# Patient Record
Sex: Female | Born: 1960
Health system: Southern US, Community
[De-identification: ages and names within clinical notes are randomized; demographics above are authoritative.]

## PROBLEM LIST (undated history)

## (undated) DIAGNOSIS — F419 Anxiety disorder, unspecified: Secondary | ICD-10-CM

## (undated) DIAGNOSIS — Z8249 Family history of ischemic heart disease and other diseases of the circulatory system: Secondary | ICD-10-CM

## (undated) HISTORY — DX: Family history of ischemic heart disease and other diseases of the circulatory system: Z82.49

## (undated) HISTORY — DX: Anxiety disorder, unspecified: F41.9

---

## 2000-03-18 ENCOUNTER — Encounter: Payer: Self-pay | Admitting: Family Medicine

## 2000-03-18 ENCOUNTER — Ambulatory Visit (HOSPITAL_COMMUNITY): Admission: RE | Admit: 2000-03-18 | Discharge: 2000-03-18 | Payer: Self-pay | Admitting: Family Medicine

## 2001-08-26 ENCOUNTER — Other Ambulatory Visit: Admission: RE | Admit: 2001-08-26 | Discharge: 2001-08-26 | Payer: Self-pay | Admitting: Obstetrics and Gynecology

## 2002-10-09 ENCOUNTER — Other Ambulatory Visit: Admission: RE | Admit: 2002-10-09 | Discharge: 2002-10-09 | Payer: Self-pay | Admitting: Obstetrics and Gynecology

## 2003-11-22 ENCOUNTER — Other Ambulatory Visit: Admission: RE | Admit: 2003-11-22 | Discharge: 2003-11-22 | Payer: Self-pay | Admitting: Obstetrics and Gynecology

## 2005-01-06 ENCOUNTER — Other Ambulatory Visit: Admission: RE | Admit: 2005-01-06 | Discharge: 2005-01-06 | Payer: Self-pay | Admitting: Obstetrics and Gynecology

## 2009-07-30 ENCOUNTER — Encounter: Admission: RE | Admit: 2009-07-30 | Discharge: 2009-07-30 | Payer: Self-pay | Admitting: Obstetrics and Gynecology

## 2012-08-18 ENCOUNTER — Other Ambulatory Visit: Payer: Self-pay | Admitting: Family Medicine

## 2012-08-18 DIAGNOSIS — R1011 Right upper quadrant pain: Secondary | ICD-10-CM

## 2012-08-22 ENCOUNTER — Other Ambulatory Visit: Payer: Self-pay

## 2012-08-26 ENCOUNTER — Ambulatory Visit
Admission: RE | Admit: 2012-08-26 | Discharge: 2012-08-26 | Disposition: A | Discharge status: Home / Self Care | Payer: PRIVATE HEALTH INSURANCE | Source: Ambulatory Visit | Attending: Family Medicine | Admitting: Family Medicine

## 2012-08-26 DIAGNOSIS — R1011 Right upper quadrant pain: Secondary | ICD-10-CM

## 2013-02-13 ENCOUNTER — Encounter: Payer: Self-pay | Admitting: Podiatry

## 2013-02-13 ENCOUNTER — Ambulatory Visit (INDEPENDENT_AMBULATORY_CARE_PROVIDER_SITE_OTHER): Payer: 59 | Admitting: Podiatry

## 2013-02-13 VITALS — BP 119/70 | HR 78 | Resp 12 | Ht 64.0 in | Wt 127.0 lb

## 2013-02-13 DIAGNOSIS — M779 Enthesopathy, unspecified: Secondary | ICD-10-CM

## 2013-02-13 NOTE — Patient Instructions (Signed)
We will contact you when orthotics are available. If you have not heard from [place] in 4-5 weeks please call the office

## 2013-02-13 NOTE — Progress Notes (Signed)
Subjective:     Patient ID: Christy Powers, female   DOB: [birth year], 52 y.o.   MRN: [id number]  Foot Pain   patient states my foot feels better but the callus is coming back and the pain is coming back   Review of Systems  All other systems reviewed and are negative.       Objective:   Physical Exam  Nursing note and vitals reviewed. Constitutional: She appears well-developed.  Cardiovascular: Intact distal pulses.   Musculoskeletal: Normal range of motion.  Neurological: She is alert.  Skin: Skin is warm.   Pain noted around the third metatarsal right.. It is inflamed and difficult for her to walk on after periods of activity    Assessment:     Capsulitis with prominent bone plantar third metatarsal right    Plan:     H&P performed. Recommended that we make her orthotics to reduce pressure against the joint surface and underlying plantarflexed. Scanned for orthotics at the current time

## 2013-04-03 ENCOUNTER — Ambulatory Visit (INDEPENDENT_AMBULATORY_CARE_PROVIDER_SITE_OTHER): Payer: 59 | Admitting: Podiatry

## 2013-04-03 DIAGNOSIS — M779 Enthesopathy, unspecified: Secondary | ICD-10-CM

## 2013-04-03 DIAGNOSIS — M216X9 Other acquired deformities of unspecified foot: Secondary | ICD-10-CM

## 2013-04-05 NOTE — Progress Notes (Signed)
Subjective:     Patient ID: Christy Powers, female   DOB: [birth year], 52 y.o.   MRN: [id number]  HPI patient states my heel is feeling better and I feel like him walking with more comfort. Still having pain a 5 been on my foot all day   Review of Systems     Objective:   Physical Exam    neurovascular status unchanged and patient is well oriented x3. Heel has reduced as far as discomfort but still present with deep palpation Assessment:     Plan her fasciitis improved with mild to moderate pain still noted    Plan:     Instructed on types of shoes which would be appropriate and not going barefoot. Dispensed custom orthotics with instructions on usage today and fitted to feet they seem to be well fitted

## 2013-04-20 ENCOUNTER — Encounter: Payer: Self-pay | Admitting: Podiatry

## 2014-08-20 ENCOUNTER — Other Ambulatory Visit: Payer: Self-pay | Admitting: Physician Assistant

## 2014-08-20 DIAGNOSIS — R1011 Right upper quadrant pain: Secondary | ICD-10-CM

## 2014-08-27 ENCOUNTER — Other Ambulatory Visit: Payer: PRIVATE HEALTH INSURANCE

## 2014-09-07 ENCOUNTER — Ambulatory Visit
Admission: RE | Admit: 2014-09-07 | Discharge: 2014-09-07 | Disposition: A | Discharge status: Home / Self Care | Payer: 59 | Source: Ambulatory Visit | Attending: Physician Assistant | Admitting: Physician Assistant

## 2014-09-07 DIAGNOSIS — R1011 Right upper quadrant pain: Secondary | ICD-10-CM

## 2016-04-21 ENCOUNTER — Other Ambulatory Visit: Payer: Self-pay | Admitting: Obstetrics and Gynecology

## 2016-04-21 DIAGNOSIS — R928 Other abnormal and inconclusive findings on diagnostic imaging of breast: Secondary | ICD-10-CM

## 2016-04-24 ENCOUNTER — Ambulatory Visit
Admission: RE | Admit: 2016-04-24 | Discharge: 2016-04-24 | Disposition: A | Discharge status: Home / Self Care | Payer: 59 | Source: Ambulatory Visit | Attending: Obstetrics and Gynecology | Admitting: Obstetrics and Gynecology

## 2016-04-24 DIAGNOSIS — R928 Other abnormal and inconclusive findings on diagnostic imaging of breast: Secondary | ICD-10-CM

## 2016-05-27 DIAGNOSIS — M9901 Segmental and somatic dysfunction of cervical region: Secondary | ICD-10-CM | POA: Diagnosis not present

## 2016-05-27 DIAGNOSIS — M9903 Segmental and somatic dysfunction of lumbar region: Secondary | ICD-10-CM | POA: Diagnosis not present

## 2016-05-27 DIAGNOSIS — M9905 Segmental and somatic dysfunction of pelvic region: Secondary | ICD-10-CM | POA: Diagnosis not present

## 2016-06-02 DIAGNOSIS — M9901 Segmental and somatic dysfunction of cervical region: Secondary | ICD-10-CM | POA: Diagnosis not present

## 2016-06-02 DIAGNOSIS — M9903 Segmental and somatic dysfunction of lumbar region: Secondary | ICD-10-CM | POA: Diagnosis not present

## 2016-06-02 DIAGNOSIS — M9905 Segmental and somatic dysfunction of pelvic region: Secondary | ICD-10-CM | POA: Diagnosis not present

## 2016-06-11 DIAGNOSIS — M9903 Segmental and somatic dysfunction of lumbar region: Secondary | ICD-10-CM | POA: Diagnosis not present

## 2016-06-11 DIAGNOSIS — M9901 Segmental and somatic dysfunction of cervical region: Secondary | ICD-10-CM | POA: Diagnosis not present

## 2016-06-11 DIAGNOSIS — M9905 Segmental and somatic dysfunction of pelvic region: Secondary | ICD-10-CM | POA: Diagnosis not present

## 2016-06-16 DIAGNOSIS — M9901 Segmental and somatic dysfunction of cervical region: Secondary | ICD-10-CM | POA: Diagnosis not present

## 2016-06-16 DIAGNOSIS — M9903 Segmental and somatic dysfunction of lumbar region: Secondary | ICD-10-CM | POA: Diagnosis not present

## 2016-06-16 DIAGNOSIS — M9905 Segmental and somatic dysfunction of pelvic region: Secondary | ICD-10-CM | POA: Diagnosis not present

## 2016-06-25 DIAGNOSIS — M9905 Segmental and somatic dysfunction of pelvic region: Secondary | ICD-10-CM | POA: Diagnosis not present

## 2016-06-25 DIAGNOSIS — M9903 Segmental and somatic dysfunction of lumbar region: Secondary | ICD-10-CM | POA: Diagnosis not present

## 2016-06-25 DIAGNOSIS — M9901 Segmental and somatic dysfunction of cervical region: Secondary | ICD-10-CM | POA: Diagnosis not present

## 2016-06-29 DIAGNOSIS — H0011 Chalazion right upper eyelid: Secondary | ICD-10-CM | POA: Diagnosis not present

## 2016-07-09 DIAGNOSIS — H0011 Chalazion right upper eyelid: Secondary | ICD-10-CM | POA: Diagnosis not present

## 2016-08-17 ENCOUNTER — Other Ambulatory Visit: Payer: Self-pay | Admitting: Gastroenterology

## 2016-08-17 DIAGNOSIS — E785 Hyperlipidemia, unspecified: Secondary | ICD-10-CM | POA: Diagnosis not present

## 2016-08-17 DIAGNOSIS — I10 Essential (primary) hypertension: Secondary | ICD-10-CM | POA: Diagnosis not present

## 2016-08-17 DIAGNOSIS — R1011 Right upper quadrant pain: Secondary | ICD-10-CM | POA: Diagnosis not present

## 2016-08-17 DIAGNOSIS — G4700 Insomnia, unspecified: Secondary | ICD-10-CM | POA: Diagnosis not present

## 2016-08-25 ENCOUNTER — Ambulatory Visit
Admission: RE | Admit: 2016-08-25 | Discharge: 2016-08-25 | Disposition: A | Discharge status: Home / Self Care | Payer: 59 | Source: Ambulatory Visit | Attending: Gastroenterology | Admitting: Gastroenterology

## 2016-08-25 DIAGNOSIS — R1011 Right upper quadrant pain: Secondary | ICD-10-CM | POA: Diagnosis not present

## 2016-08-25 MED ORDER — IOPAMIDOL (ISOVUE-300) INJECTION 61%
100.0000 mL | Freq: Once | INTRAVENOUS | Status: AC | PRN
  Administered 2016-08-25: 100 mL via INTRAVENOUS

## 2016-11-11 DIAGNOSIS — M9902 Segmental and somatic dysfunction of thoracic region: Secondary | ICD-10-CM | POA: Diagnosis not present

## 2016-11-11 DIAGNOSIS — M542 Cervicalgia: Secondary | ICD-10-CM | POA: Diagnosis not present

## 2016-11-11 DIAGNOSIS — M9901 Segmental and somatic dysfunction of cervical region: Secondary | ICD-10-CM | POA: Diagnosis not present

## 2016-11-16 DIAGNOSIS — M9902 Segmental and somatic dysfunction of thoracic region: Secondary | ICD-10-CM | POA: Diagnosis not present

## 2016-11-16 DIAGNOSIS — M9901 Segmental and somatic dysfunction of cervical region: Secondary | ICD-10-CM | POA: Diagnosis not present

## 2016-11-16 DIAGNOSIS — M542 Cervicalgia: Secondary | ICD-10-CM | POA: Diagnosis not present

## 2016-11-20 DIAGNOSIS — M545 Low back pain: Secondary | ICD-10-CM | POA: Diagnosis not present

## 2016-12-21 DIAGNOSIS — M9901 Segmental and somatic dysfunction of cervical region: Secondary | ICD-10-CM | POA: Diagnosis not present

## 2016-12-21 DIAGNOSIS — M9902 Segmental and somatic dysfunction of thoracic region: Secondary | ICD-10-CM | POA: Diagnosis not present

## 2016-12-21 DIAGNOSIS — M542 Cervicalgia: Secondary | ICD-10-CM | POA: Diagnosis not present

## 2017-04-19 DIAGNOSIS — Z01419 Encounter for gynecological examination (general) (routine) without abnormal findings: Secondary | ICD-10-CM | POA: Diagnosis not present

## 2017-04-19 DIAGNOSIS — Z1231 Encounter for screening mammogram for malignant neoplasm of breast: Secondary | ICD-10-CM | POA: Diagnosis not present

## 2017-08-29 DIAGNOSIS — N390 Urinary tract infection, site not specified: Secondary | ICD-10-CM | POA: Diagnosis not present

## 2017-09-08 DIAGNOSIS — D225 Melanocytic nevi of trunk: Secondary | ICD-10-CM | POA: Diagnosis not present

## 2017-09-08 DIAGNOSIS — L219 Seborrheic dermatitis, unspecified: Secondary | ICD-10-CM | POA: Diagnosis not present

## 2017-09-08 DIAGNOSIS — L821 Other seborrheic keratosis: Secondary | ICD-10-CM | POA: Diagnosis not present

## 2018-01-04 DIAGNOSIS — M545 Low back pain: Secondary | ICD-10-CM | POA: Diagnosis not present

## 2018-01-07 DIAGNOSIS — I10 Essential (primary) hypertension: Secondary | ICD-10-CM | POA: Diagnosis not present

## 2018-01-07 DIAGNOSIS — E7800 Pure hypercholesterolemia, unspecified: Secondary | ICD-10-CM | POA: Diagnosis not present

## 2018-01-07 DIAGNOSIS — M545 Low back pain: Secondary | ICD-10-CM | POA: Diagnosis not present

## 2018-01-10 DIAGNOSIS — M9903 Segmental and somatic dysfunction of lumbar region: Secondary | ICD-10-CM | POA: Diagnosis not present

## 2018-01-10 DIAGNOSIS — M9905 Segmental and somatic dysfunction of pelvic region: Secondary | ICD-10-CM | POA: Diagnosis not present

## 2018-01-10 DIAGNOSIS — M9904 Segmental and somatic dysfunction of sacral region: Secondary | ICD-10-CM | POA: Diagnosis not present

## 2018-01-13 DIAGNOSIS — M9902 Segmental and somatic dysfunction of thoracic region: Secondary | ICD-10-CM | POA: Diagnosis not present

## 2018-01-13 DIAGNOSIS — M9901 Segmental and somatic dysfunction of cervical region: Secondary | ICD-10-CM | POA: Diagnosis not present

## 2018-01-13 DIAGNOSIS — M9903 Segmental and somatic dysfunction of lumbar region: Secondary | ICD-10-CM | POA: Diagnosis not present

## 2018-01-27 DIAGNOSIS — N390 Urinary tract infection, site not specified: Secondary | ICD-10-CM | POA: Diagnosis not present

## 2019-05-10 DIAGNOSIS — M5020 Other cervical disc displacement, unspecified cervical region: Secondary | ICD-10-CM | POA: Diagnosis not present

## 2019-05-10 DIAGNOSIS — M9904 Segmental and somatic dysfunction of sacral region: Secondary | ICD-10-CM | POA: Diagnosis not present

## 2019-05-10 DIAGNOSIS — M9903 Segmental and somatic dysfunction of lumbar region: Secondary | ICD-10-CM | POA: Diagnosis not present

## 2019-05-10 DIAGNOSIS — M9901 Segmental and somatic dysfunction of cervical region: Secondary | ICD-10-CM | POA: Diagnosis not present

## 2019-10-16 DIAGNOSIS — M9903 Segmental and somatic dysfunction of lumbar region: Secondary | ICD-10-CM | POA: Diagnosis not present

## 2019-10-16 DIAGNOSIS — M5020 Other cervical disc displacement, unspecified cervical region: Secondary | ICD-10-CM | POA: Diagnosis not present

## 2019-10-16 DIAGNOSIS — M9904 Segmental and somatic dysfunction of sacral region: Secondary | ICD-10-CM | POA: Diagnosis not present

## 2019-10-16 DIAGNOSIS — M9901 Segmental and somatic dysfunction of cervical region: Secondary | ICD-10-CM | POA: Diagnosis not present

## 2019-10-17 DIAGNOSIS — L719 Rosacea, unspecified: Secondary | ICD-10-CM | POA: Diagnosis not present

## 2019-10-17 DIAGNOSIS — D225 Melanocytic nevi of trunk: Secondary | ICD-10-CM | POA: Diagnosis not present

## 2019-10-17 DIAGNOSIS — L219 Seborrheic dermatitis, unspecified: Secondary | ICD-10-CM | POA: Diagnosis not present

## 2019-10-17 DIAGNOSIS — L578 Other skin changes due to chronic exposure to nonionizing radiation: Secondary | ICD-10-CM | POA: Diagnosis not present

## 2019-10-18 DIAGNOSIS — M5020 Other cervical disc displacement, unspecified cervical region: Secondary | ICD-10-CM | POA: Diagnosis not present

## 2019-10-18 DIAGNOSIS — M9901 Segmental and somatic dysfunction of cervical region: Secondary | ICD-10-CM | POA: Diagnosis not present

## 2019-10-18 DIAGNOSIS — M9903 Segmental and somatic dysfunction of lumbar region: Secondary | ICD-10-CM | POA: Diagnosis not present

## 2019-10-18 DIAGNOSIS — M9904 Segmental and somatic dysfunction of sacral region: Secondary | ICD-10-CM | POA: Diagnosis not present

## 2019-11-16 DIAGNOSIS — M5020 Other cervical disc displacement, unspecified cervical region: Secondary | ICD-10-CM | POA: Diagnosis not present

## 2019-11-16 DIAGNOSIS — M9901 Segmental and somatic dysfunction of cervical region: Secondary | ICD-10-CM | POA: Diagnosis not present

## 2019-11-16 DIAGNOSIS — M9904 Segmental and somatic dysfunction of sacral region: Secondary | ICD-10-CM | POA: Diagnosis not present

## 2019-11-16 DIAGNOSIS — M9903 Segmental and somatic dysfunction of lumbar region: Secondary | ICD-10-CM | POA: Diagnosis not present

## 2019-11-21 DIAGNOSIS — R064 Hyperventilation: Secondary | ICD-10-CM | POA: Diagnosis not present

## 2019-11-21 DIAGNOSIS — R079 Chest pain, unspecified: Secondary | ICD-10-CM | POA: Diagnosis not present

## 2019-11-21 DIAGNOSIS — G4700 Insomnia, unspecified: Secondary | ICD-10-CM | POA: Diagnosis not present

## 2019-11-22 DIAGNOSIS — M9903 Segmental and somatic dysfunction of lumbar region: Secondary | ICD-10-CM | POA: Diagnosis not present

## 2019-11-22 DIAGNOSIS — M5020 Other cervical disc displacement, unspecified cervical region: Secondary | ICD-10-CM | POA: Diagnosis not present

## 2019-11-22 DIAGNOSIS — M9904 Segmental and somatic dysfunction of sacral region: Secondary | ICD-10-CM | POA: Diagnosis not present

## 2019-11-22 DIAGNOSIS — M9901 Segmental and somatic dysfunction of cervical region: Secondary | ICD-10-CM | POA: Diagnosis not present

## 2019-11-29 DIAGNOSIS — M9901 Segmental and somatic dysfunction of cervical region: Secondary | ICD-10-CM | POA: Diagnosis not present

## 2019-11-29 DIAGNOSIS — M9904 Segmental and somatic dysfunction of sacral region: Secondary | ICD-10-CM | POA: Diagnosis not present

## 2019-11-29 DIAGNOSIS — M9903 Segmental and somatic dysfunction of lumbar region: Secondary | ICD-10-CM | POA: Diagnosis not present

## 2019-11-29 DIAGNOSIS — M5020 Other cervical disc displacement, unspecified cervical region: Secondary | ICD-10-CM | POA: Diagnosis not present

## 2020-01-05 ENCOUNTER — Encounter: Payer: Self-pay | Admitting: Cardiovascular Disease

## 2020-01-05 ENCOUNTER — Ambulatory Visit (INDEPENDENT_AMBULATORY_CARE_PROVIDER_SITE_OTHER)
Admission: RE | Admit: 2020-01-05 | Discharge: 2020-01-05 | Disposition: A | Discharge status: Home / Self Care | Payer: Self-pay | Source: Ambulatory Visit | Attending: Cardiovascular Disease | Admitting: Cardiovascular Disease

## 2020-01-05 ENCOUNTER — Encounter (INDEPENDENT_AMBULATORY_CARE_PROVIDER_SITE_OTHER): Payer: Self-pay

## 2020-01-05 ENCOUNTER — Other Ambulatory Visit: Payer: Self-pay

## 2020-01-05 ENCOUNTER — Ambulatory Visit (INDEPENDENT_AMBULATORY_CARE_PROVIDER_SITE_OTHER): Payer: BC Managed Care – PPO | Admitting: Cardiovascular Disease

## 2020-01-05 ENCOUNTER — Telehealth: Payer: Self-pay

## 2020-01-05 VITALS — BP 140/92 | HR 76 | Ht 64.5 in | Wt 153.2 lb

## 2020-01-05 DIAGNOSIS — I10 Essential (primary) hypertension: Secondary | ICD-10-CM

## 2020-01-05 DIAGNOSIS — Z8249 Family history of ischemic heart disease and other diseases of the circulatory system: Secondary | ICD-10-CM | POA: Diagnosis not present

## 2020-01-05 DIAGNOSIS — E782 Mixed hyperlipidemia: Secondary | ICD-10-CM | POA: Diagnosis not present

## 2020-01-05 DIAGNOSIS — E785 Hyperlipidemia, unspecified: Secondary | ICD-10-CM

## 2020-01-05 MED ORDER — ROSUVASTATIN CALCIUM 10 MG PO TABS: 10.0000 mg | ORAL_TABLET | Freq: Every day | ORAL | 3 refills | Status: DC

## 2020-01-05 NOTE — Progress Notes (Signed)
Cardiology Office Note:    Date:  01/05/2020   ID:  Christy Powers, DOB February 08, 1961, MRN [id number]  PCP:  Maury Dus, MD  [facility] Cardiologist:  [facility] Electrophysiologist:  None   Referring MD: Maury Dus, MD   Chief Complaint  Patient presents with  . Hypertension    History of Present Illness:    Christy Powers is a 59 y.o. female with a hx of HTN.   She has a strong family hx of CAD.   We were asked to see her today by Dr. Alyson Ingles for further evaluation of her CV risk.  HTN for years , has been well controlled.   Both mother and father had premature heart disease / stroke . Has a hx of HLD - is on pravachol  Walks pm a regular basis - 1-2 miles a day .  No CP or dyspnea.   No syncope or presyncope   Non smoker  No complaints of any sort.   Just here to assess risk and get an understanding of how she can improve   Past Medical History:  Diagnosis Date  . Anxiety   . Family history of cardiovascular disease     History reviewed. No pertinent surgical history.  Current Medications: Current Meds  Medication Sig  . ALPRAZolam (XANAX) 0.25 MG tablet Take 0.25 mg by mouth at bedtime as needed for anxiety.  . fluocinonide (LIDEX) 0.05 % external solution Apply 1 application topically 2 (two) times daily.  Marland Kitchen ketoconazole (NIZORAL) 2 % shampoo Apply 1 application topically 2 (two) times a week.  Marland Kitchen lisinopril (ZESTRIL) 10 MG tablet Take 10 mg by mouth every morning.  . pravastatin (PRAVACHOL) 40 MG tablet      Allergies:   Patient has no known allergies.   Social History   Socioeconomic History  . Marital status: Married    Spouse name: Not on file  . Number of children: Not on file  . Years of education: Not on file  . Highest education level: Not on file  Occupational History  . Not on file  Tobacco Use  . Smoking status: Never Smoker  . Smokeless tobacco: Never Used  Substance and Sexual Activity  . Alcohol use: Not on file  . Drug  use: Not on file  . Sexual activity: Yes  Other Topics Concern  . Not on file  Social History Narrative  . Not on file   Social Determinants of Health   Financial Resource Strain:   . Difficulty of Paying Living Expenses: Not on file  Food Insecurity:   . Worried About Charity fundraiser in the Last Year: Not on file  . Ran Out of Food in the Last Year: Not on file  Transportation Needs:   . Lack of Transportation (Medical): Not on file  . Lack of Transportation (Non-Medical): Not on file  Physical Activity:   . Days of Exercise per Week: Not on file  . Minutes of Exercise per Session: Not on file  Stress:   . Feeling of Stress : Not on file  Social Connections:   . Frequency of Communication with Friends and Family: Not on file  . Frequency of Social Gatherings with Friends and Family: Not on file  . Attends Religious Services: Not on file  . Active Member of Clubs or Organizations: Not on file  . Attends Archivist Meetings: Not on file  . Marital Status: Not on file     Family  History: The patient's family history includes CVA in her mother; Heart attack in her father; Heart disease (age of onset: 47) in her father; Hypercholesterolemia in her mother; Hypertension in her mother.  ROS:   Please see the history of present illness.     All other systems reviewed and are negative.  EKGs/Labs/Other Studies Reviewed:    The following studies were reviewed today:   EKG:   November 21, 2019: Normal sinus rhythm at 72.  No ST or T wave changes.  Recent Labs: No results found for requested labs within last 8760 hours.  Recent Lipid Panel No results found for: CHOL, TRIG, HDL, CHOLHDL, VLDL, LDLCALC, LDLDIRECT  Physical Exam:    VS:  BP (!) 140/92   Pulse 76   Ht 5' 4.5" (1.638 m)   Wt 153 lb 3.2 oz (69.5 kg)   SpO2 100%   BMI 25.89 kg/m     Wt Readings from Last 3 Encounters:  01/05/20 153 lb 3.2 oz (69.5 kg)  02/13/13 127 lb (57.6 kg)     GEN:  Well  nourished, well developed in no acute distress HEENT: Normal NECK: No JVD; No carotid bruits LYMPHATICS: No lymphadenopathy CARDIAC: RRR, very soft , benign   murmur  RESPIRATORY:  Clear to auscultation without rales, wheezing or rhonchi  ABDOMEN: Soft, non-tender, non-distended MUSCULOSKELETAL:  No edema; No deformity  SKIN: Warm and dry NEUROLOGIC:  Alert and oriented x 3 PSYCHIATRIC:  Normal affect   ASSESSMENT:    1. Hypertension, unspecified type   2. Hyperlipidemia, unspecified hyperlipidemia type   3. Family history of early CAD   45. Mixed hyperlipidemia   5. Essential hypertension   6. Family history of premature CAD    PLAN:      1. Hyperlipidemia:   Reviewed labs from her primary medical doctor from September, 2020: Her total cholesterol was 241.  The HDL is 103.  LDL is 121.  The triglyceride level is 81.  I like to get an NMR lipid profile for further evaluation of her lipids.  We will also get a coronary CT angiogram to assess whether or not she has any early coronary artery disease.  She may need a more aggressive lipid management if she is found to have evidence of CAD.  She is completely asymptomatic and exercises on a fairly regular basis.  2.  Essential Hypertension: Blood pressure is typically well controlled.  It is a little elevated today because of the added stress of coming to the doctor.  She checks her blood pressure on intermittent basis.  She tries to avoid salty foods.  3.  Family history of CAD and stroke :   Father had an MI and died at age 23.   He was a heavy smoker Mother had several strokes - was also a smoker      Medication Adjustments/Labs and Tests Ordered: Current medicines are reviewed at length with the patient today.  Concerns regarding medicines are outlined above.  Orders Placed This Encounter  Procedures  . CT CARDIAC SCORING  . Hepatic function panel  . Basic metabolic panel  . NMR, lipoprofile   No orders of the defined  types were placed in this encounter.    Patient Instructions  Medication Instructions:  Your provider recommends that you continue on your current medications as directed. Please refer to the Current Medication list given to you today.   *If you need a refill on your cardiac medications before your next appointment, please  call your pharmacy*  Lab Work: TODAY: NMR, BMET, liver If you have labs (blood work) drawn today and your tests are completely normal, you will receive your results only by: Marland Kitchen MyChart Message (if you have MyChart) OR . A paper copy in the mail If you have any lab test that is abnormal or we need to change your treatment, we will call you to review the results.  Testing/Procedures: Dr. Acie Fredrickson recommends you have a CALCIUM SCORE.  Follow-Up: At [facility], you and your health needs are our priority.  As part of our continuing mission to provide you with exceptional heart care, we have created designated Provider Care Teams.  These Care Teams include your primary Cardiologist (physician) and Advanced Practice Providers (APPs -  Physician Assistants and Nurse Practitioners) who all work together to provide you with the care you need, when you need it. Your next appointment:   12 month(s) The format for your next appointment:   In Person Provider:   You may see Dr. Acie Fredrickson or one of the following Advanced Practice Providers on your designated Care Team:    Richardson Dopp, PA-C  Robbie Lis, [place]      Signed, Mertie Moores, MD  01/05/2020 1:10 PM    [place]

## 2020-01-05 NOTE — Telephone Encounter (Signed)
Reviewed results with patient who verbalized understanding.   Instructed patient to STOP PRAVACHOL and START Rosuvastatin 10 mg daily.  Lipids, liver, BMET scheduled 12/2. The patient was grateful for assistance.

## 2020-01-05 NOTE — Telephone Encounter (Signed)
-----   Message from Darrell Jewel, RN sent at 01/05/2020  4:40 PM EDT ----- Regarding: lab and med changes  ----- Message ----- From: Thayer Headings, MD Sent: 01/05/2020   4:37 PM EDT To: Rebeca Alert [facility] Triage  Coronary calcium score of 24. This was 5 percentile for age and sex matched control.  Overall , this is not very elevated.   She has no symptoms.  We will continue to aggressively treat her lipids.  She has known hyperlipidemia . Please DC Pravachol  Start Rosuvastatin 10 mg a day . Check lipids, liver enz, BMP in 3 months

## 2020-01-05 NOTE — Patient Instructions (Signed)
Medication Instructions:  Your provider recommends that you continue on your current medications as directed. Please refer to the Current Medication list given to you today.   *If you need a refill on your cardiac medications before your next appointment, please call your pharmacy*  Lab Work: TODAY: NMR, BMET, liver If you have labs (blood work) drawn today and your tests are completely normal, you will receive your results only by: Marland Kitchen MyChart Message (if you have MyChart) OR . A paper copy in the mail If you have any lab test that is abnormal or we need to change your treatment, we will call you to review the results.  Testing/Procedures: Dr. Acie Fredrickson recommends you have a CALCIUM SCORE.  Follow-Up: At [facility], you and your health needs are our priority.  As part of our continuing mission to provide you with exceptional heart care, we have created designated Provider Care Teams.  These Care Teams include your primary Cardiologist (physician) and Advanced Practice Providers (APPs -  Physician Assistants and Nurse Practitioners) who all work together to provide you with the care you need, when you need it. Your next appointment:   12 month(s) The format for your next appointment:   In Person Provider:   You may see Dr. Acie Fredrickson or one of the following Advanced Practice Providers on your designated Care Team:    Richardson Dopp, PA-C  [place], [place]

## 2020-01-06 LAB — HEPATIC FUNCTION PANEL
ALT: 15 IU/L (ref 0–32)
AST: 16 IU/L (ref 0–40)
Albumin: 4.5 g/dL (ref 3.8–4.9)
Alkaline Phosphatase: 78 IU/L (ref 48–121)
Bilirubin Total: 0.5 mg/dL (ref 0.0–1.2)
Bilirubin, Direct: 0.14 mg/dL (ref 0.00–0.40)
Total Protein: 6.8 g/dL (ref 6.0–8.5)

## 2020-01-06 LAB — NMR, LIPOPROFILE
Cholesterol, Total: 218 mg/dL — ABNORMAL HIGH (ref 100–199)
HDL Particle Number: 54.4 umol/L (ref >=30.5)
HDL-C: 108 mg/dL (ref >39)
LDL Particle Number: 921 nmol/L (ref <1000)
LDL Size: 21.6 nm (ref >20.5)
LDL-C (NIH Calc): 99 mg/dL (ref 0–99)
LP-IR Score: <25 (ref <=45)
Small LDL Particle Number: <90 nmol/L (ref <=527)
Triglycerides: 64 mg/dL (ref 0–149)

## 2020-01-06 LAB — BASIC METABOLIC PANEL
BUN/Creatinine Ratio: 12 (ref 9–23)
BUN: 11 mg/dL (ref 6–24)
CO2: 24 mmol/L (ref 20–29)
Calcium: 9.2 mg/dL (ref 8.7–10.2)
Chloride: 102 mmol/L (ref 96–106)
Creatinine, Ser: 0.92 mg/dL (ref 0.57–1.00)
GFR calc Af Amer: 79 mL/min/{1.73_m2} (ref >59)
GFR calc non Af Amer: 68 mL/min/{1.73_m2} (ref >59)
Glucose: 88 mg/dL (ref 65–99)
Potassium: 4.1 mmol/L (ref 3.5–5.2)
Sodium: 142 mmol/L (ref 134–144)

## 2020-01-09 NOTE — Addendum Note (Signed)
Addended by: Harland German A on: 01/09/2020 08:33 AM   Modules accepted: Orders

## 2020-02-06 MED ORDER — LISINOPRIL 10 MG PO TABS: 10.0000 mg | ORAL_TABLET | Freq: Every morning | ORAL | 3 refills | Status: DC

## 2020-04-04 ENCOUNTER — Other Ambulatory Visit: Payer: BC Managed Care – PPO | Admitting: *Deleted

## 2020-04-04 ENCOUNTER — Other Ambulatory Visit: Payer: Self-pay

## 2020-04-04 DIAGNOSIS — E782 Mixed hyperlipidemia: Secondary | ICD-10-CM

## 2020-04-05 LAB — BASIC METABOLIC PANEL WITH GFR
BUN/Creatinine Ratio: 19 (ref 9–23)
BUN: 16 mg/dL (ref 6–24)
CO2: 27 mmol/L (ref 20–29)
Calcium: 9.3 mg/dL (ref 8.7–10.2)
Chloride: 104 mmol/L (ref 96–106)
Creatinine, Ser: 0.83 mg/dL (ref 0.57–1.00)
GFR calc Af Amer: 89 mL/min/{1.73_m2}
GFR calc non Af Amer: 77 mL/min/{1.73_m2}
Glucose: 89 mg/dL (ref 65–99)
Potassium: 4.5 mmol/L (ref 3.5–5.2)
Sodium: 143 mmol/L (ref 134–144)

## 2020-04-05 LAB — NMR, LIPOPROFILE
Cholesterol, Total: 197 mg/dL (ref 100–199)
HDL Particle Number: 57.5 umol/L (ref >=30.5)
HDL-C: 108 mg/dL (ref >39)
LDL Particle Number: 663 nmol/L (ref <1000)
LDL Size: 21.2 nm (ref >20.5)
LDL-C (NIH Calc): 76 mg/dL (ref 0–99)
LP-IR Score: 37 (ref <=45)
Small LDL Particle Number: <90 nmol/L (ref <=527)
Triglycerides: 73 mg/dL (ref 0–149)

## 2020-04-05 LAB — HEPATIC FUNCTION PANEL
ALT: 23 [IU]/L (ref 0–32)
AST: 17 [IU]/L (ref 0–40)
Albumin: 4.4 g/dL (ref 3.8–4.9)
Alkaline Phosphatase: 74 [IU]/L (ref 44–121)
Bilirubin Total: 0.7 mg/dL (ref 0.0–1.2)
Bilirubin, Direct: 0.21 mg/dL (ref 0.00–0.40)
Total Protein: 6.3 g/dL (ref 6.0–8.5)

## 2020-04-08 ENCOUNTER — Telehealth: Payer: Self-pay | Admitting: Cardiovascular Disease

## 2020-04-08 NOTE — Telephone Encounter (Signed)
New message:    Patient calling to ask some questions concering her medications. pleawse call patient back.

## 2020-04-08 NOTE — Telephone Encounter (Signed)
See lab results.  

## 2020-04-21 DIAGNOSIS — R1032 Left lower quadrant pain: Secondary | ICD-10-CM | POA: Diagnosis not present

## 2020-04-21 DIAGNOSIS — R197 Diarrhea, unspecified: Secondary | ICD-10-CM | POA: Diagnosis not present

## 2020-04-21 DIAGNOSIS — K5732 Diverticulitis of large intestine without perforation or abscess without bleeding: Secondary | ICD-10-CM | POA: Diagnosis not present

## 2020-04-24 DIAGNOSIS — R319 Hematuria, unspecified: Secondary | ICD-10-CM | POA: Diagnosis not present

## 2020-04-24 DIAGNOSIS — N390 Urinary tract infection, site not specified: Secondary | ICD-10-CM | POA: Diagnosis not present

## 2020-04-24 DIAGNOSIS — Z01419 Encounter for gynecological examination (general) (routine) without abnormal findings: Secondary | ICD-10-CM | POA: Diagnosis not present

## 2020-04-24 DIAGNOSIS — Z6826 Body mass index (BMI) 26.0-26.9, adult: Secondary | ICD-10-CM | POA: Diagnosis not present

## 2020-04-24 DIAGNOSIS — Z1231 Encounter for screening mammogram for malignant neoplasm of breast: Secondary | ICD-10-CM | POA: Diagnosis not present

## 2020-10-07 ENCOUNTER — Ambulatory Visit (INDEPENDENT_AMBULATORY_CARE_PROVIDER_SITE_OTHER): Payer: BC Managed Care – PPO | Admitting: Podiatry

## 2020-10-07 ENCOUNTER — Ambulatory Visit: Payer: BC Managed Care – PPO

## 2020-10-07 ENCOUNTER — Telehealth: Payer: Self-pay | Admitting: Cardiovascular Disease

## 2020-10-07 ENCOUNTER — Other Ambulatory Visit: Payer: Self-pay

## 2020-10-07 ENCOUNTER — Ambulatory Visit (INDEPENDENT_AMBULATORY_CARE_PROVIDER_SITE_OTHER): Payer: BC Managed Care – PPO

## 2020-10-07 ENCOUNTER — Encounter: Payer: Self-pay | Admitting: Podiatry

## 2020-10-07 DIAGNOSIS — M79672 Pain in left foot: Secondary | ICD-10-CM | POA: Diagnosis not present

## 2020-10-07 DIAGNOSIS — M79671 Pain in right foot: Secondary | ICD-10-CM

## 2020-10-07 DIAGNOSIS — M720 Palmar fascial fibromatosis [Dupuytren]: Secondary | ICD-10-CM | POA: Diagnosis not present

## 2020-10-07 DIAGNOSIS — R1011 Right upper quadrant pain: Secondary | ICD-10-CM | POA: Diagnosis not present

## 2020-10-07 DIAGNOSIS — M778 Other enthesopathies, not elsewhere classified: Secondary | ICD-10-CM

## 2020-10-07 MED ORDER — TRIAMCINOLONE ACETONIDE 10 MG/ML IJ SUSP
10.0000 mg | Freq: Once | INTRAMUSCULAR | Status: AC
  Administered 2020-10-07: 10 mg

## 2020-10-07 NOTE — Telephone Encounter (Signed)
Christy Powers is calling due to scheduling her 1 year follow up with Dr. Acie Fredrickson that is due in September. She is wanting to know if he is wanting her to repeat the CT that she got last year. If so she would like it same day and an order would need to be placed. She is requesting a callback from the nurse with confirmation.

## 2020-10-08 ENCOUNTER — Other Ambulatory Visit: Payer: Self-pay | Admitting: Family Medicine

## 2020-10-08 DIAGNOSIS — R1011 Right upper quadrant pain: Secondary | ICD-10-CM

## 2020-10-08 NOTE — Progress Notes (Signed)
Subjective:   Patient ID: Christy Powers, female   DOB: 60 y.o.   MRN: [id number]   HPI Patient presents stating she has a lot of pain in her right foot and it is making it difficult for her to walk.  States its been getting worse over the last few months and she has trouble bearing weight on her foot.  Patient does not smoke would like to be more active   Review of Systems  All other systems reviewed and are negative.       Objective:  Physical Exam Vitals and nursing note reviewed.  Constitutional:      Appearance: She is well-developed.  Pulmonary:     Effort: Pulmonary effort is normal.  Musculoskeletal:        General: Normal range of motion.  Skin:    General: Skin is warm.  Neurological:     Mental Status: She is alert.     Neurovascular status found to be intact muscle strength was found to be adequate range of motion adequate.  Patient is noted to have exquisite discomfort around the second MPJ right foot with inflammation fluid around the joint surface painful when pressed.  Patient has good digital perfusion well oriented x3     Assessment:  Acute capsulitis with inflammation fluid buildup     Plan:  H&P reviewed condition and at this point sterile prep and injected around the joint surface with 3 mg dexamethasone Kenalog 5 mg Xylocaine advised on rigid bottom shoes padding therapy anti-inflammatories and reappoint to  X-rays were negative for signs of arthritis or stress fracture associated with condition

## 2020-10-08 NOTE — Patient Instructions (Addendum)
Okay third possibility for thyroiditis throat chronic

## 2020-10-08 NOTE — Telephone Encounter (Signed)
RN spoke with patient to confirm that she does not need the calcium score CT this year for her appointment per Dr. Acie Fredrickson. Patient verbalized understanding. Patient has appointment scheduled for 9/6.

## 2020-10-24 ENCOUNTER — Ambulatory Visit
Admission: RE | Admit: 2020-10-24 | Discharge: 2020-10-24 | Disposition: A | Discharge status: Home / Self Care | Payer: BC Managed Care – PPO | Source: Ambulatory Visit | Attending: Family Medicine | Admitting: Family Medicine

## 2020-10-24 DIAGNOSIS — R1011 Right upper quadrant pain: Secondary | ICD-10-CM

## 2020-10-24 DIAGNOSIS — K760 Fatty (change of) liver, not elsewhere classified: Secondary | ICD-10-CM | POA: Diagnosis not present

## 2020-12-03 DIAGNOSIS — K838 Other specified diseases of biliary tract: Secondary | ICD-10-CM | POA: Diagnosis not present

## 2020-12-03 DIAGNOSIS — R1011 Right upper quadrant pain: Secondary | ICD-10-CM | POA: Diagnosis not present

## 2020-12-04 ENCOUNTER — Other Ambulatory Visit: Payer: Self-pay | Admitting: Gastroenterology

## 2020-12-04 DIAGNOSIS — K838 Other specified diseases of biliary tract: Secondary | ICD-10-CM

## 2020-12-18 DIAGNOSIS — L219 Seborrheic dermatitis, unspecified: Secondary | ICD-10-CM | POA: Diagnosis not present

## 2020-12-18 DIAGNOSIS — D225 Melanocytic nevi of trunk: Secondary | ICD-10-CM | POA: Diagnosis not present

## 2020-12-18 DIAGNOSIS — L570 Actinic keratosis: Secondary | ICD-10-CM | POA: Diagnosis not present

## 2020-12-18 DIAGNOSIS — L578 Other skin changes due to chronic exposure to nonionizing radiation: Secondary | ICD-10-CM | POA: Diagnosis not present

## 2020-12-18 DIAGNOSIS — L719 Rosacea, unspecified: Secondary | ICD-10-CM | POA: Diagnosis not present

## 2020-12-21 ENCOUNTER — Other Ambulatory Visit: Payer: Self-pay

## 2020-12-21 ENCOUNTER — Ambulatory Visit
Admission: RE | Admit: 2020-12-21 | Discharge: 2020-12-21 | Disposition: A | Discharge status: Home / Self Care | Payer: BC Managed Care – PPO | Source: Ambulatory Visit | Attending: Gastroenterology | Admitting: Gastroenterology

## 2020-12-21 DIAGNOSIS — K838 Other specified diseases of biliary tract: Secondary | ICD-10-CM | POA: Diagnosis not present

## 2020-12-21 MED ORDER — GADOBENATE DIMEGLUMINE 529 MG/ML IV SOLN
14.0000 mL | Freq: Once | INTRAVENOUS | Status: AC | PRN
  Administered 2020-12-21: 14 mL via INTRAVENOUS

## 2021-01-01 ENCOUNTER — Other Ambulatory Visit: Payer: Self-pay | Admitting: Cardiovascular Disease

## 2021-01-06 NOTE — Progress Notes (Signed)
Cardiology Office Note:    Date:  01/07/2021   ID:  Christy Powers, DOB [birth year], MRN [id number]  PCP:  Maury Dus, MD  [facility] Cardiologist:  [facility] Electrophysiologist:  None   Referring MD: Maury Dus, MD   Chief Complaint  Patient presents with   Hypertension   Hyperlipidemia    Previous notes:    Christy Powers is a 60 y.o. female with a hx of HTN.   She has a strong family hx of CAD.   We were asked to see her today by Dr. Alyson Ingles for further evaluation of her CV risk.  HTN for years , has been well controlled.   Both mother and father had premature heart disease / stroke . Has a hx of HLD - is on pravachol  Walks pm a regular basis - 1-2 miles a day .  No CP or dyspnea.   No syncope or presyncope   Non smoker  No complaints of any sort.   Just here to assess risk and get an understanding of how she can improve   Sept. 6, 2022 Christy Powers is here to follow up with her HTN and HLD.   Has a strong family hx of premature CAD Coronary calcium score of 24. This was 80 percentile for age and sex matched control. In Dec. 2021 LDL particle number was 663.   LDL = 76. Walks regularly    Past Medical History:  Diagnosis Date   Anxiety    Family history of cardiovascular disease     No past surgical history on file.  Current Medications: Current Meds  Medication Sig   ALPRAZolam (XANAX) 0.25 MG tablet Take 0.25 mg by mouth at bedtime as needed for anxiety.   Coenzyme Q10 (CO Q 10) 10 MG CAPS Take by mouth.   fluocinonide (LIDEX) 0.05 % external solution Apply 1 application topically 2 (two) times daily.   ketoconazole (NIZORAL) 2 % shampoo Apply 1 application topically 2 (two) times a week.   Multiple Vitamin (MULTIVITAMIN ADULT) TABS Take by mouth daily.   rosuvastatin (CRESTOR) 10 MG tablet TAKE 1 TABLET BY MOUTH EVERY DAY   TURMERIC PO Take by mouth.   [DISCONTINUED] lisinopril (ZESTRIL) 10 MG tablet TAKE 1 TABLET BY MOUTH EVERY DAY IN THE  MORNING   [DISCONTINUED] valsartan (DIOVAN) 160 MG tablet Take 1 tablet (160 mg total) by mouth daily.     Allergies:   Patient has no known allergies.   Social History   Socioeconomic History   Marital status: Married    Spouse name: Not on file   Number of children: Not on file   Years of education: Not on file   Highest education level: Not on file  Occupational History   Not on file  Tobacco Use   Smoking status: Never   Smokeless tobacco: Never  Substance and Sexual Activity   Alcohol use: Not on file   Drug use: Not on file   Sexual activity: Yes  Other Topics Concern   Not on file  Social History Narrative   Not on file   Social Determinants of Health   Financial Resource Strain: Not on file  Food Insecurity: Not on file  Transportation Needs: Not on file  Physical Activity: Not on file  Stress: Not on file  Social Connections: Not on file     Family History: The patient's family history includes CVA in her mother; Heart attack in her father; Heart disease (age of onset:  34) in her father; Hypercholesterolemia in her mother; Hypertension in her mother.  ROS:   Please see the history of present illness.     All other systems reviewed and are negative.  EKGs/Labs/Other Studies Reviewed:    The following studies were reviewed today:   EKG:   November 21, 2019: Normal sinus rhythm at 72.  No ST or T wave changes.  Recent Labs: 04/04/2020: ALT 23; BUN 16; Creatinine, Ser 0.83; Potassium 4.5; Sodium 143  Recent Lipid Panel No results found for: CHOL, TRIG, HDL, CHOLHDL, VLDL, LDLCALC, LDLDIRECT  Physical Exam:    Physical Exam: Blood pressure 138/88, pulse 88, height 5' 4.5" (1.638 m), weight 165 lb 9.6 oz (75.1 kg), SpO2 99 %.  GEN:  Well nourished, well developed in no acute distress HEENT: Normal NECK: No JVD; No carotid bruits LYMPHATICS: No lymphadenopathy CARDIAC: RRR , no murmurs, rubs, gallops RESPIRATORY:  Clear to auscultation without rales,  wheezing or rhonchi  ABDOMEN: Soft, non-tender, non-distended MUSCULOSKELETAL:  No edema; No deformity  SKIN: Warm and dry NEUROLOGIC:  Alert and oriented x 3  ECG: January 07, 2021: Normal sinus rhythm at 88.  No ST or T wave changes.  ASSESSMENT:    1. Essential hypertension   2. Mixed hyperlipidemia   3. Family history of early CAD     PLAN:      Hyperlipidemia:  labs look stable Check lipids in 3 weeks .  Continue rosuvastatin.  She has very minimal coronary artery calcification.  She has a strong family history of coronary artery disease.    2.  Essential Hypertension: Blood pressures have been a little bit elevated.  We will discontinue lisinopril and try her on valsartan 160 mg a day.  We will check a basic metabolic profile in 3 weeks. .  3.  Family history of CAD and stroke :    Continue aggressive lipid management and blood pressure management.    Medication Adjustments/Labs and Tests Ordered: Current medicines are reviewed at length with the patient today.  Concerns regarding medicines are outlined above.  Orders Placed This Encounter  Procedures   Basic Metabolic Panel (BMET)   Lipid Profile   Hepatic function panel   EKG 12-Lead    Meds ordered this encounter  Medications   DISCONTD: valsartan (DIOVAN) 160 MG tablet    Sig: Take 1 tablet (160 mg total) by mouth daily.    Dispense:  30 tablet    Refill:  11    Order Specific Question:   Supervising Provider    Answer:   Mertie Moores J [[id number]]   valsartan (DIOVAN) 160 MG tablet    Sig: Take 1 tablet (160 mg total) by mouth daily.    Dispense:  30 tablet    Refill:  11    Order Specific Question:   Supervising Provider    Answer:   Thayer Headings [phone]    I will see her again in 1 year.  Patient Instructions  Medication Instructions:  Your physician has recommended you make the following change in your medication: STOP Lisinopril START Valsartan 160 mg once daily  *If you need a refill  on your cardiac medications before your next appointment, please call your pharmacy*   Lab Work: Your physician recommends that you return for lab work on Monday Sept. 26. You may come in anytime after 7:30 am.   You will need to FAST for this appointment - nothing to eat or drink after midnight the night  before except water.  If you have labs (blood work) drawn today and your tests are completely normal, you will receive your results only by: [place] (if you have MyChart) OR A paper copy in the mail If you have any lab test that is abnormal or we need to change your treatment, we will call you to review the results.   Testing/Procedures: None Ordered   Follow-Up: At [facility], you and your health needs are our priority.  As part of our continuing mission to provide you with exceptional heart care, we have created designated Provider Care Teams.  These Care Teams include your primary Cardiologist (physician) and Advanced Practice Providers (APPs -  Physician Assistants and Nurse Practitioners) who all work together to provide you with the care you need, when you need it.   Your next appointment:   1 year(s)  The format for your next appointment:   In Person  Provider:   You may see Mertie Moores, MD or one of the following Advanced Practice Providers on your designated Care Team:   Richardson Dopp, PA-C Robbie Lis, [place]    Signed, Mertie Moores, MD  01/07/2021 10:32 AM    [place]

## 2021-01-07 ENCOUNTER — Ambulatory Visit (INDEPENDENT_AMBULATORY_CARE_PROVIDER_SITE_OTHER): Payer: BC Managed Care – PPO | Admitting: Cardiovascular Disease

## 2021-01-07 ENCOUNTER — Encounter: Payer: Self-pay | Admitting: Cardiovascular Disease

## 2021-01-07 ENCOUNTER — Other Ambulatory Visit: Payer: Self-pay

## 2021-01-07 VITALS — BP 138/88 | HR 88 | Ht 64.5 in | Wt 165.6 lb

## 2021-01-07 DIAGNOSIS — I10 Essential (primary) hypertension: Secondary | ICD-10-CM | POA: Diagnosis not present

## 2021-01-07 DIAGNOSIS — E782 Mixed hyperlipidemia: Secondary | ICD-10-CM

## 2021-01-07 DIAGNOSIS — Z8249 Family history of ischemic heart disease and other diseases of the circulatory system: Secondary | ICD-10-CM

## 2021-01-07 MED ORDER — VALSARTAN 160 MG PO TABS: 160.0000 mg | ORAL_TABLET | Freq: Every day | ORAL | 11 refills | Status: DC

## 2021-01-07 NOTE — Patient Instructions (Signed)
Medication Instructions:  Your physician has recommended you make the following change in your medication: STOP Lisinopril START Valsartan 160 mg once daily  *If you need a refill on your cardiac medications before your next appointment, please call your pharmacy*   Lab Work: Your physician recommends that you return for lab work on Monday Sept. 26. You may come in anytime after 7:30 am.   You will need to FAST for this appointment - nothing to eat or drink after midnight the night before except water.  If you have labs (blood work) drawn today and your tests are completely normal, you will receive your results only by: [place] (if you have MyChart) OR A paper copy in the mail If you have any lab test that is abnormal or we need to change your treatment, we will call you to review the results.   Testing/Procedures: None Ordered   Follow-Up: At [facility], you and your health needs are our priority.  As part of our continuing mission to provide you with exceptional heart care, we have created designated Provider Care Teams.  These Care Teams include your primary Cardiologist (physician) and Advanced Practice Providers (APPs -  Physician Assistants and Nurse Practitioners) who all work together to provide you with the care you need, when you need it.   Your next appointment:   1 year(s)  The format for your next appointment:   In Person  Provider:   You may see Mertie Moores, MD or one of the following Advanced Practice Providers on your designated Care Team:   Richardson Dopp, PA-C [place], [place]

## 2021-01-21 DIAGNOSIS — M9901 Segmental and somatic dysfunction of cervical region: Secondary | ICD-10-CM | POA: Diagnosis not present

## 2021-01-21 DIAGNOSIS — M9903 Segmental and somatic dysfunction of lumbar region: Secondary | ICD-10-CM | POA: Diagnosis not present

## 2021-01-21 DIAGNOSIS — G44209 Tension-type headache, unspecified, not intractable: Secondary | ICD-10-CM | POA: Diagnosis not present

## 2021-01-21 DIAGNOSIS — M9902 Segmental and somatic dysfunction of thoracic region: Secondary | ICD-10-CM | POA: Diagnosis not present

## 2021-01-27 ENCOUNTER — Other Ambulatory Visit: Payer: BC Managed Care – PPO | Admitting: *Deleted

## 2021-01-27 ENCOUNTER — Other Ambulatory Visit: Payer: Self-pay

## 2021-01-27 DIAGNOSIS — I10 Essential (primary) hypertension: Secondary | ICD-10-CM | POA: Diagnosis not present

## 2021-01-27 DIAGNOSIS — E782 Mixed hyperlipidemia: Secondary | ICD-10-CM

## 2021-01-27 DIAGNOSIS — Z8249 Family history of ischemic heart disease and other diseases of the circulatory system: Secondary | ICD-10-CM

## 2021-01-27 LAB — BASIC METABOLIC PANEL
BUN/Creatinine Ratio: 15 (ref 12–28)
BUN: 13 mg/dL (ref 8–27)
CO2: 22 mmol/L (ref 20–29)
Calcium: 8.9 mg/dL (ref 8.7–10.3)
Chloride: 104 mmol/L (ref 96–106)
Creatinine, Ser: 0.84 mg/dL (ref 0.57–1.00)
Glucose: 103 mg/dL — ABNORMAL HIGH (ref 65–99)
Potassium: 4.3 mmol/L (ref 3.5–5.2)
Sodium: 144 mmol/L (ref 134–144)
eGFR: 80 mL/min/{1.73_m2} (ref >59)

## 2021-01-27 LAB — LIPID PANEL
Chol/HDL Ratio: 2.1 ratio (ref 0.0–4.4)
Cholesterol, Total: 220 mg/dL — ABNORMAL HIGH (ref 100–199)
HDL: 104 mg/dL (ref >39)
LDL Chol Calc (NIH): 96 mg/dL (ref 0–99)
Triglycerides: 119 mg/dL (ref 0–149)
VLDL Cholesterol Cal: 20 mg/dL (ref 5–40)

## 2021-01-27 LAB — HEPATIC FUNCTION PANEL
ALT: 24 IU/L (ref 0–32)
AST: 21 IU/L (ref 0–40)
Albumin: 4.7 g/dL (ref 3.8–4.9)
Alkaline Phosphatase: 84 IU/L (ref 44–121)
Bilirubin Total: 0.4 mg/dL (ref 0.0–1.2)
Bilirubin, Direct: <0.1 mg/dL (ref 0.00–0.40)
Total Protein: 6.6 g/dL (ref 6.0–8.5)

## 2021-01-28 DIAGNOSIS — G44209 Tension-type headache, unspecified, not intractable: Secondary | ICD-10-CM | POA: Diagnosis not present

## 2021-01-28 DIAGNOSIS — M9903 Segmental and somatic dysfunction of lumbar region: Secondary | ICD-10-CM | POA: Diagnosis not present

## 2021-01-28 DIAGNOSIS — M9901 Segmental and somatic dysfunction of cervical region: Secondary | ICD-10-CM | POA: Diagnosis not present

## 2021-01-28 DIAGNOSIS — M9902 Segmental and somatic dysfunction of thoracic region: Secondary | ICD-10-CM | POA: Diagnosis not present

## 2021-01-29 ENCOUNTER — Telehealth: Payer: Self-pay | Admitting: *Deleted

## 2021-01-29 ENCOUNTER — Other Ambulatory Visit: Payer: Self-pay | Admitting: Cardiovascular Disease

## 2021-01-29 DIAGNOSIS — E782 Mixed hyperlipidemia: Secondary | ICD-10-CM

## 2021-01-29 MED ORDER — ROSUVASTATIN CALCIUM 20 MG PO TABS: 20.0000 mg | ORAL_TABLET | Freq: Every day | ORAL | 3 refills | Status: DC

## 2021-01-29 NOTE — Telephone Encounter (Signed)
-----   Message from Thayer Headings, MD sent at 01/29/2021 12:15 PM EDT ----- LDL is higher than her previous levels. Is she still on her rosuvastatin 10 mg a day  If so, we need to increase the rosuvastatin to 20 mg a day Continue diet and exercise Recheck lipids and ALT in 3 months

## 2021-01-29 NOTE — Telephone Encounter (Signed)
Patient notified.  She is taking Rosuvastatin 10 mg every day.  She will increase to 20 mg daily and come in for fasting lab work on 12/19. Prescription sent to [facility] on [address]

## 2021-02-07 DIAGNOSIS — L738 Other specified follicular disorders: Secondary | ICD-10-CM | POA: Diagnosis not present

## 2021-02-07 DIAGNOSIS — L219 Seborrheic dermatitis, unspecified: Secondary | ICD-10-CM | POA: Diagnosis not present

## 2021-02-07 DIAGNOSIS — L570 Actinic keratosis: Secondary | ICD-10-CM | POA: Diagnosis not present

## 2021-02-12 DIAGNOSIS — G44209 Tension-type headache, unspecified, not intractable: Secondary | ICD-10-CM | POA: Diagnosis not present

## 2021-02-12 DIAGNOSIS — M9902 Segmental and somatic dysfunction of thoracic region: Secondary | ICD-10-CM | POA: Diagnosis not present

## 2021-02-12 DIAGNOSIS — M9903 Segmental and somatic dysfunction of lumbar region: Secondary | ICD-10-CM | POA: Diagnosis not present

## 2021-02-12 DIAGNOSIS — M9901 Segmental and somatic dysfunction of cervical region: Secondary | ICD-10-CM | POA: Diagnosis not present

## 2021-02-19 DIAGNOSIS — K838 Other specified diseases of biliary tract: Secondary | ICD-10-CM | POA: Diagnosis not present

## 2021-02-19 DIAGNOSIS — K319 Disease of stomach and duodenum, unspecified: Secondary | ICD-10-CM | POA: Diagnosis not present

## 2021-02-19 DIAGNOSIS — R1011 Right upper quadrant pain: Secondary | ICD-10-CM | POA: Diagnosis not present

## 2021-02-27 DIAGNOSIS — M9901 Segmental and somatic dysfunction of cervical region: Secondary | ICD-10-CM | POA: Diagnosis not present

## 2021-02-27 DIAGNOSIS — G44209 Tension-type headache, unspecified, not intractable: Secondary | ICD-10-CM | POA: Diagnosis not present

## 2021-02-27 DIAGNOSIS — M9902 Segmental and somatic dysfunction of thoracic region: Secondary | ICD-10-CM | POA: Diagnosis not present

## 2021-02-27 DIAGNOSIS — M9903 Segmental and somatic dysfunction of lumbar region: Secondary | ICD-10-CM | POA: Diagnosis not present

## 2021-03-31 DIAGNOSIS — M9902 Segmental and somatic dysfunction of thoracic region: Secondary | ICD-10-CM | POA: Diagnosis not present

## 2021-03-31 DIAGNOSIS — M9903 Segmental and somatic dysfunction of lumbar region: Secondary | ICD-10-CM | POA: Diagnosis not present

## 2021-03-31 DIAGNOSIS — M9901 Segmental and somatic dysfunction of cervical region: Secondary | ICD-10-CM | POA: Diagnosis not present

## 2021-03-31 DIAGNOSIS — G44209 Tension-type headache, unspecified, not intractable: Secondary | ICD-10-CM | POA: Diagnosis not present

## 2021-04-01 ENCOUNTER — Other Ambulatory Visit: Payer: Self-pay | Admitting: Cardiovascular Disease

## 2021-04-04 DIAGNOSIS — Z23 Encounter for immunization: Secondary | ICD-10-CM | POA: Diagnosis not present

## 2021-04-04 DIAGNOSIS — L570 Actinic keratosis: Secondary | ICD-10-CM | POA: Diagnosis not present

## 2021-04-07 DIAGNOSIS — K838 Other specified diseases of biliary tract: Secondary | ICD-10-CM | POA: Diagnosis not present

## 2021-04-07 DIAGNOSIS — K2970 Gastritis, unspecified, without bleeding: Secondary | ICD-10-CM | POA: Diagnosis not present

## 2021-04-07 DIAGNOSIS — R1011 Right upper quadrant pain: Secondary | ICD-10-CM | POA: Diagnosis not present

## 2021-04-15 DIAGNOSIS — G44209 Tension-type headache, unspecified, not intractable: Secondary | ICD-10-CM | POA: Diagnosis not present

## 2021-04-15 DIAGNOSIS — M9902 Segmental and somatic dysfunction of thoracic region: Secondary | ICD-10-CM | POA: Diagnosis not present

## 2021-04-15 DIAGNOSIS — M9901 Segmental and somatic dysfunction of cervical region: Secondary | ICD-10-CM | POA: Diagnosis not present

## 2021-04-15 DIAGNOSIS — M9903 Segmental and somatic dysfunction of lumbar region: Secondary | ICD-10-CM | POA: Diagnosis not present

## 2021-04-21 ENCOUNTER — Other Ambulatory Visit: Payer: BC Managed Care – PPO

## 2021-04-23 DIAGNOSIS — M9903 Segmental and somatic dysfunction of lumbar region: Secondary | ICD-10-CM | POA: Diagnosis not present

## 2021-04-23 DIAGNOSIS — M9902 Segmental and somatic dysfunction of thoracic region: Secondary | ICD-10-CM | POA: Diagnosis not present

## 2021-04-23 DIAGNOSIS — G44209 Tension-type headache, unspecified, not intractable: Secondary | ICD-10-CM | POA: Diagnosis not present

## 2021-04-23 DIAGNOSIS — M9901 Segmental and somatic dysfunction of cervical region: Secondary | ICD-10-CM | POA: Diagnosis not present

## 2021-06-03 ENCOUNTER — Other Ambulatory Visit: Payer: BC Managed Care – PPO | Admitting: *Deleted

## 2021-06-03 ENCOUNTER — Other Ambulatory Visit: Payer: Self-pay

## 2021-06-03 DIAGNOSIS — E782 Mixed hyperlipidemia: Secondary | ICD-10-CM

## 2021-06-03 LAB — LIPID PANEL
Chol/HDL Ratio: 1.9 ratio (ref 0.0–4.4)
Cholesterol, Total: 195 mg/dL (ref 100–199)
HDL: 101 mg/dL (ref >39)
LDL Chol Calc (NIH): 74 mg/dL (ref 0–99)
Triglycerides: 118 mg/dL (ref 0–149)
VLDL Cholesterol Cal: 20 mg/dL (ref 5–40)

## 2021-06-03 LAB — ALT: ALT: 30 IU/L (ref 0–32)

## 2021-06-18 DIAGNOSIS — Z13820 Encounter for screening for osteoporosis: Secondary | ICD-10-CM | POA: Diagnosis not present

## 2021-06-18 DIAGNOSIS — Z01419 Encounter for gynecological examination (general) (routine) without abnormal findings: Secondary | ICD-10-CM | POA: Diagnosis not present

## 2021-06-18 DIAGNOSIS — Z1231 Encounter for screening mammogram for malignant neoplasm of breast: Secondary | ICD-10-CM | POA: Diagnosis not present

## 2021-06-18 DIAGNOSIS — Z6829 Body mass index (BMI) 29.0-29.9, adult: Secondary | ICD-10-CM | POA: Diagnosis not present

## 2021-08-08 ENCOUNTER — Encounter: Payer: Self-pay | Admitting: Cardiovascular Disease

## 2021-08-27 DIAGNOSIS — G44209 Tension-type headache, unspecified, not intractable: Secondary | ICD-10-CM | POA: Diagnosis not present

## 2021-08-27 DIAGNOSIS — M9902 Segmental and somatic dysfunction of thoracic region: Secondary | ICD-10-CM | POA: Diagnosis not present

## 2021-08-27 DIAGNOSIS — M9903 Segmental and somatic dysfunction of lumbar region: Secondary | ICD-10-CM | POA: Diagnosis not present

## 2021-08-27 DIAGNOSIS — M9901 Segmental and somatic dysfunction of cervical region: Secondary | ICD-10-CM | POA: Diagnosis not present

## 2021-09-05 DIAGNOSIS — I10 Essential (primary) hypertension: Secondary | ICD-10-CM | POA: Diagnosis not present

## 2021-09-05 DIAGNOSIS — R0789 Other chest pain: Secondary | ICD-10-CM | POA: Diagnosis not present

## 2021-09-10 DIAGNOSIS — G44209 Tension-type headache, unspecified, not intractable: Secondary | ICD-10-CM | POA: Diagnosis not present

## 2021-09-10 DIAGNOSIS — M9903 Segmental and somatic dysfunction of lumbar region: Secondary | ICD-10-CM | POA: Diagnosis not present

## 2021-09-10 DIAGNOSIS — M9901 Segmental and somatic dysfunction of cervical region: Secondary | ICD-10-CM | POA: Diagnosis not present

## 2021-09-10 DIAGNOSIS — M9902 Segmental and somatic dysfunction of thoracic region: Secondary | ICD-10-CM | POA: Diagnosis not present

## 2021-10-16 DIAGNOSIS — H40013 Open angle with borderline findings, low risk, bilateral: Secondary | ICD-10-CM | POA: Diagnosis not present

## 2021-12-24 DIAGNOSIS — M9902 Segmental and somatic dysfunction of thoracic region: Secondary | ICD-10-CM | POA: Diagnosis not present

## 2021-12-24 DIAGNOSIS — M9903 Segmental and somatic dysfunction of lumbar region: Secondary | ICD-10-CM | POA: Diagnosis not present

## 2021-12-24 DIAGNOSIS — G44209 Tension-type headache, unspecified, not intractable: Secondary | ICD-10-CM | POA: Diagnosis not present

## 2021-12-24 DIAGNOSIS — M9901 Segmental and somatic dysfunction of cervical region: Secondary | ICD-10-CM | POA: Diagnosis not present

## 2021-12-25 DIAGNOSIS — D2272 Melanocytic nevi of left lower limb, including hip: Secondary | ICD-10-CM | POA: Diagnosis not present

## 2021-12-25 DIAGNOSIS — L219 Seborrheic dermatitis, unspecified: Secondary | ICD-10-CM | POA: Diagnosis not present

## 2021-12-25 DIAGNOSIS — D225 Melanocytic nevi of trunk: Secondary | ICD-10-CM | POA: Diagnosis not present

## 2021-12-25 DIAGNOSIS — L578 Other skin changes due to chronic exposure to nonionizing radiation: Secondary | ICD-10-CM | POA: Diagnosis not present

## 2022-01-08 ENCOUNTER — Ambulatory Visit: Payer: BC Managed Care – PPO | Attending: Cardiovascular Disease | Admitting: Cardiovascular Disease

## 2022-01-08 ENCOUNTER — Encounter: Payer: Self-pay | Admitting: Cardiovascular Disease

## 2022-01-08 VITALS — BP 136/74 | HR 72 | Ht 64.5 in | Wt 167.8 lb

## 2022-01-08 DIAGNOSIS — I10 Essential (primary) hypertension: Secondary | ICD-10-CM

## 2022-01-08 DIAGNOSIS — E782 Mixed hyperlipidemia: Secondary | ICD-10-CM

## 2022-01-08 DIAGNOSIS — R739 Hyperglycemia, unspecified: Secondary | ICD-10-CM | POA: Diagnosis not present

## 2022-01-08 DIAGNOSIS — I2510 Atherosclerotic heart disease of native coronary artery without angina pectoris: Secondary | ICD-10-CM | POA: Diagnosis not present

## 2022-01-08 MED ORDER — VALSARTAN 160 MG PO TABS: 160.0000 mg | ORAL_TABLET | Freq: Every day | ORAL | 3 refills | Status: DC

## 2022-01-08 NOTE — Patient Instructions (Signed)
Medication Instructions:  REFILLED Valsartan  *If you need a refill on your cardiac medications before your next appointment, please call your pharmacy*   Lab Work: Lipids, ALT, BMET, HbA1C, Lipoprotein (a) If you have labs (blood work) drawn today and your tests are completely normal, you will receive your results only by: [place] (if you have MyChart) OR A paper copy in the mail If you have any lab test that is abnormal or we need to change your treatment, we will call you to review the results.   Testing/Procedures: NONE  Follow-Up: At [facility], you and your health needs are our priority.  As part of our continuing mission to provide you with exceptional heart care, we have created designated Provider Care Teams.  These Care Teams include your primary Cardiologist (physician) and Advanced Practice Providers (APPs -  Physician Assistants and Nurse Practitioners) who all work together to provide you with the care you need, when you need it.  Your next appointment:   1 year(s)  The format for your next appointment:   In Person  Provider:   Mertie Moores, MD       Important Information About Sugar

## 2022-01-08 NOTE — Progress Notes (Signed)
Cardiology Office Note:    Date:  01/08/2022   ID:  Christy Powers, DOB July 13, 1960, MRN [id number]  PCP:  Maury Dus, MD  [facility] Cardiologist:  [facility] Electrophysiologist:  None   Referring MD: Maury Dus, MD   Chief Complaint  Patient presents with   Hypertension         Previous notes:    Christy Powers is a 61 y.o. female with a hx of HTN.   She has a strong family hx of CAD.   We were asked to see her today by Dr. Alyson Ingles for further evaluation of her CV risk.  HTN for years , has been well controlled.   Both mother and father had premature heart disease / stroke . Has a hx of HLD - is on pravachol  Walks pm a regular basis - 1-2 miles a day .  No CP or dyspnea.   No syncope or presyncope   Non smoker  No complaints of any sort.   Just here to assess risk and get an understanding of how she can improve   Sept. 6, 2022 Christy Powers is here to follow up with her HTN and HLD.   Has a strong family hx of premature CAD Coronary calcium score of 24. This was 67 percentile for age and sex matched control. In Dec. 2021 LDL particle number was 663.   LDL = 76. Walks regularly   Sept 7, 2023   Christy Powers regularly  Father died of MI at age 51  Mother had strokes     Past Medical History:  Diagnosis Date   Anxiety    Family history of cardiovascular disease     History reviewed. No pertinent surgical history.  Current Medications: No outpatient medications have been marked as taking for the 01/08/22 encounter (Office Visit) with Yaa Donnellan, Wonda Cheng, MD.     Allergies:   Patient has no known allergies.   Social History   Socioeconomic History   Marital status: Married    Spouse name: Not on file   Number of children: Not on file   Years of education: Not on file   Highest education level: Not on file  Occupational History   Not on file  Tobacco Use   Smoking status: Never   Smokeless tobacco: Never  Substance and Sexual Activity   Alcohol use:  Not on file   Drug use: Not on file   Sexual activity: Yes  Other Topics Concern   Not on file  Social History Narrative   Not on file   Social Determinants of Health   Financial Resource Strain: Not on file  Food Insecurity: Not on file  Transportation Needs: Not on file  Physical Activity: Not on file  Stress: Not on file  Social Connections: Not on file     Family History: The patient's family history includes CVA in her mother; Heart attack in her father; Heart disease (age of onset: 37) in her father; Hypercholesterolemia in her mother; Hypertension in her mother.  ROS:   Please see the history of present illness.     All other systems reviewed and are negative.  EKGs/Labs/Other Studies Reviewed:    The following studies were reviewed today:   EKG:    January 08, 2022: Normal sinus rhythm at 72.  Sinus arrhythmia.  Normal EKG.  Recent Labs: 01/27/2021: BUN 13; Creatinine, Ser 0.84; Potassium 4.3; Sodium 144 06/03/2021: ALT 30  Recent Lipid Panel    Component Value  Date/Time   CHOL 195 06/03/2021 0726   TRIG 118 06/03/2021 0726   HDL 101 06/03/2021 0726   CHOLHDL 1.9 06/03/2021 0726   LDLCALC 74 06/03/2021 0726    Physical Exam:    Physical Exam: Blood pressure 136/74, pulse 72, height 5' 4.5" (1.638 m), weight 167 lb 12.8 oz (76.1 kg), SpO2 99 %.       GEN:  Well nourished, well developed in no acute distress HEENT: Normal NECK: No JVD; No carotid bruits LYMPHATICS: No lymphadenopathy CARDIAC: RRR , no murmurs, rubs, gallops RESPIRATORY:  Clear to auscultation without rales, wheezing or rhonchi  ABDOMEN: Soft, non-tender, non-distended MUSCULOSKELETAL:  No edema; No deformity  SKIN: Warm and dry NEUROLOGIC:  Alert and oriented x 3     ASSESSMENT:    1. Mixed hyperlipidemia   2. Hyperglycemia   3. Primary hypertension   4. Coronary artery disease involving native coronary artery of native heart without angina pectoris      PLAN:       Hyperlipidemia: LDL is 74.  She has a strong family history of coronary artery disease.  She also has a coronary calcium score in the 20s.  I would like for her LDL to be 55.  We will also measure a lipoprotein a.  If her lipoprotein a is elevated I would push for her to be started on a PCSK9 inhibitor.    2.  Essential Hypertension:  .  3.  Family history of CAD and stroke   4.  Hyperglycemia: We will check hemoglobin A1c today. I advised her to eat a low carb diet.  She is to continue exercising regularly.     Medication Adjustments/Labs and Tests Ordered: Current medicines are reviewed at length with the patient today.  Concerns regarding medicines are outlined above.  Orders Placed This Encounter  Procedures   Lipoprotein A (LPA)   Basic metabolic panel   Lipid panel   ALT   Hemoglobin A1c   EKG 12-Lead    Meds ordered this encounter  Medications   valsartan (DIOVAN) 160 MG tablet    Sig: Take 1 tablet (160 mg total) by mouth daily.    Dispense:  90 tablet    Refill:  3    I will see her again in 1 year.  Patient Instructions  Medication Instructions:  REFILLED Valsartan  *If you need a refill on your cardiac medications before your next appointment, please call your pharmacy*   Lab Work: Lipids, ALT, BMET, HbA1C, Lipoprotein (a) If you have labs (blood work) drawn today and your tests are completely normal, you will receive your results only by: [place] (if you have MyChart) OR A paper copy in the mail If you have any lab test that is abnormal or we need to change your treatment, we will call you to review the results.   Testing/Procedures: NONE  Follow-Up: At [facility], you and your health needs are our priority.  As part of our continuing mission to provide you with exceptional heart care, we have created designated Provider Care Teams.  These Care Teams include your primary Cardiologist (physician) and Advanced Practice Providers  (APPs -  Physician Assistants and Nurse Practitioners) who all work together to provide you with the care you need, when you need it.  Your next appointment:   1 year(s)  The format for your next appointment:   In Person  Provider:   Mertie Moores, MD       Important  Information About Sugar         Signed, Mertie Moores, MD  01/08/2022 6:45 PM    [facility]

## 2022-01-09 LAB — BASIC METABOLIC PANEL
BUN/Creatinine Ratio: 12 (ref 12–28)
BUN: 10 mg/dL (ref 8–27)
CO2: 25 mmol/L (ref 20–29)
Calcium: 9.2 mg/dL (ref 8.7–10.3)
Chloride: 104 mmol/L (ref 96–106)
Creatinine, Ser: 0.85 mg/dL (ref 0.57–1.00)
Glucose: 103 mg/dL — ABNORMAL HIGH (ref 70–99)
Potassium: 4.2 mmol/L (ref 3.5–5.2)
Sodium: 142 mmol/L (ref 134–144)
eGFR: 78 mL/min/{1.73_m2} (ref >59)

## 2022-01-09 LAB — LIPID PANEL
Chol/HDL Ratio: 1.7 ratio (ref 0.0–4.4)
Cholesterol, Total: 158 mg/dL (ref 100–199)
HDL: 93 mg/dL (ref >39)
LDL Chol Calc (NIH): 53 mg/dL (ref 0–99)
Triglycerides: 57 mg/dL (ref 0–149)
VLDL Cholesterol Cal: 12 mg/dL (ref 5–40)

## 2022-01-09 LAB — LIPOPROTEIN A (LPA): Lipoprotein (a): 8.6 nmol/L (ref <75.0)

## 2022-01-09 LAB — ALT: ALT: 24 IU/L (ref 0–32)

## 2022-01-09 LAB — HEMOGLOBIN A1C
Est. average glucose Bld gHb Est-mCnc: 114 mg/dL
Hgb A1c MFr Bld: 5.6 % (ref 4.8–5.6)

## 2022-01-16 ENCOUNTER — Encounter: Payer: Self-pay | Admitting: Cardiovascular Disease

## 2022-01-16 MED ORDER — ROSUVASTATIN CALCIUM 20 MG PO TABS: 20.0000 mg | ORAL_TABLET | Freq: Every day | ORAL | 3 refills | Status: DC

## 2022-01-26 ENCOUNTER — Other Ambulatory Visit (HOSPITAL_COMMUNITY): Payer: Self-pay | Admitting: Gastroenterology

## 2022-01-26 DIAGNOSIS — K838 Other specified diseases of biliary tract: Secondary | ICD-10-CM | POA: Diagnosis not present

## 2022-01-26 DIAGNOSIS — R1011 Right upper quadrant pain: Secondary | ICD-10-CM

## 2022-01-26 DIAGNOSIS — K2970 Gastritis, unspecified, without bleeding: Secondary | ICD-10-CM | POA: Diagnosis not present

## 2022-02-19 ENCOUNTER — Ambulatory Visit (HOSPITAL_COMMUNITY)
Admission: RE | Admit: 2022-02-19 | Discharge: 2022-02-19 | Disposition: A | Discharge status: Home / Self Care | Payer: BC Managed Care – PPO | Source: Ambulatory Visit | Attending: Gastroenterology | Admitting: Gastroenterology

## 2022-02-19 DIAGNOSIS — R1011 Right upper quadrant pain: Secondary | ICD-10-CM | POA: Diagnosis not present

## 2022-02-19 MED ORDER — TECHNETIUM TC 99M MEBROFENIN IV KIT
5.3000 | PACK | Freq: Once | INTRAVENOUS | Status: AC | PRN
  Administered 2022-02-19: 5.3 via INTRAVENOUS

## 2022-02-23 DIAGNOSIS — R1011 Right upper quadrant pain: Secondary | ICD-10-CM | POA: Diagnosis not present

## 2022-02-26 ENCOUNTER — Other Ambulatory Visit: Payer: Self-pay | Admitting: Gastroenterology

## 2022-02-26 DIAGNOSIS — R1011 Right upper quadrant pain: Secondary | ICD-10-CM

## 2022-02-26 DIAGNOSIS — K838 Other specified diseases of biliary tract: Secondary | ICD-10-CM

## 2022-03-04 DIAGNOSIS — U071 COVID-19: Secondary | ICD-10-CM | POA: Diagnosis not present

## 2022-03-04 DIAGNOSIS — R509 Fever, unspecified: Secondary | ICD-10-CM | POA: Diagnosis not present

## 2022-03-14 ENCOUNTER — Encounter: Payer: Self-pay | Admitting: Gastroenterology

## 2022-03-19 ENCOUNTER — Ambulatory Visit
Admission: RE | Admit: 2022-03-19 | Discharge: 2022-03-19 | Disposition: A | Discharge status: Home / Self Care | Payer: BC Managed Care – PPO | Source: Ambulatory Visit | Attending: Gastroenterology | Admitting: Gastroenterology

## 2022-03-19 DIAGNOSIS — R1011 Right upper quadrant pain: Secondary | ICD-10-CM

## 2022-03-19 DIAGNOSIS — K838 Other specified diseases of biliary tract: Secondary | ICD-10-CM | POA: Diagnosis not present

## 2022-03-19 DIAGNOSIS — K760 Fatty (change of) liver, not elsewhere classified: Secondary | ICD-10-CM | POA: Diagnosis not present

## 2022-03-19 MED ORDER — GADOPICLENOL 0.5 MMOL/ML IV SOLN
8.0000 mL | Freq: Once | INTRAVENOUS | Status: AC | PRN
  Administered 2022-03-19: 8 mL via INTRAVENOUS

## 2022-03-23 ENCOUNTER — Ambulatory Visit (INDEPENDENT_AMBULATORY_CARE_PROVIDER_SITE_OTHER): Payer: BC Managed Care – PPO | Admitting: Podiatry

## 2022-03-23 ENCOUNTER — Ambulatory Visit (INDEPENDENT_AMBULATORY_CARE_PROVIDER_SITE_OTHER): Payer: BC Managed Care – PPO

## 2022-03-23 ENCOUNTER — Encounter: Payer: Self-pay | Admitting: Podiatry

## 2022-03-23 DIAGNOSIS — M778 Other enthesopathies, not elsewhere classified: Secondary | ICD-10-CM

## 2022-03-23 DIAGNOSIS — M722 Plantar fascial fibromatosis: Secondary | ICD-10-CM

## 2022-03-23 MED ORDER — DICLOFENAC SODIUM 75 MG PO TBEC: 75.0000 mg | DELAYED_RELEASE_TABLET | Freq: Two times a day (BID) | ORAL | 2 refills | Status: DC

## 2022-03-23 NOTE — Patient Instructions (Signed)

## 2022-03-24 NOTE — Progress Notes (Signed)
Subjective:   Patient ID: Christy Powers, female   DOB: 61 y.o.   MRN: [id number]   HPI Patient continues to experience discomfort in the plantar aspect of the right heel with inflammation fluid noted and states it is worse when she gets up in the morning after periods of sitting   ROS      Objective:  Physical Exam  Acute fascial symptomatology that at this point is still not resolving     Assessment:  Pain that is present in the medial fascial band at the insertional point of the tendon into the calcaneus with fluid buildup noted     Plan:  Reviewed condition at this point I have recommended anti-inflammatories to be continued and I placed the patient into a night splint.  I did discuss the possibility that this require surgery or we will consider orthotics or combination but I want to see response to night splint which was fitted properly at the current time.  Reappoint 3 weeks

## 2022-03-31 DIAGNOSIS — L814 Other melanin hyperpigmentation: Secondary | ICD-10-CM | POA: Diagnosis not present

## 2022-03-31 DIAGNOSIS — L719 Rosacea, unspecified: Secondary | ICD-10-CM | POA: Diagnosis not present

## 2022-04-13 ENCOUNTER — Telehealth: Payer: Self-pay | Admitting: Podiatry

## 2022-04-13 ENCOUNTER — Encounter: Payer: Self-pay | Admitting: Podiatry

## 2022-04-13 ENCOUNTER — Ambulatory Visit (INDEPENDENT_AMBULATORY_CARE_PROVIDER_SITE_OTHER): Payer: BC Managed Care – PPO | Admitting: Podiatry

## 2022-04-13 DIAGNOSIS — M722 Plantar fascial fibromatosis: Secondary | ICD-10-CM

## 2022-04-13 MED ORDER — DICLOFENAC SODIUM 75 MG PO TBEC: 75.0000 mg | DELAYED_RELEASE_TABLET | Freq: Two times a day (BID) | ORAL | 2 refills | Status: DC

## 2022-04-13 NOTE — Progress Notes (Signed)
Subjective:   Patient ID: Christy Powers, female   DOB: 61 y.o.   MRN: [id number]   HPI Patient states the arch is feeling some better but I am still having severe discomfort around the second joint right that simply not gotten better and was treated a year and a half ago.  Patient has had this for a long period of time and the pain seems to be getting worse   ROS      Objective:  Physical Exam  Neurovascular status intact muscle strength adequate with patient found to have severe discomfort second metatarsal phalangeal joint right with elevation of the second toe with redness at the inner phalangeal joint with this occurring over the last couple years.  Patient does not walk well on her foot the arch appears to be better with minimal discomfort currently     Assessment:  Chronic inflammatory capsulitis with probable damage to the flexor plate second MPJ right it is not done well over the last couple years and has been treated conservatively with shoe gear modifications injection treatment oral anti-inflammatories and padding     Plan:  H and P reviewed condition and previous x-rays at great length.  Due to long-term chronic nature of condition I recommended digital fusion along with shortening osteotomy patient wants this done I explained procedure risk and she is amendable to the surgery.  At this point I allowed her to read consent form going over alternative treatments complications associated with procedure and after extensive review patient signed consent form scheduled for outpatient surgery with all questions answered.  She understands total recovery can take 6 months no guarantee this will resolve all pain.  I then discussed the boot that I want her to wear postoperatively and it was dispensed today fitted properly to her lower leg and I want her to wear it to get used to it prior to surgery and also to help her arch during the healing process.  Patient to be seen back for surgical  intervention

## 2022-04-13 NOTE — Telephone Encounter (Signed)
DOS: 04/21/2022  [employer]  Metatarsal Osteotomy 2nd Rt ([id number]) Hammertoe Repair w/ Pin 2nd Rt ([id number])  Deductible: $3,000 with $0 remaining Out-of-Pocket: $3,000 with $202 remaining CoInsurance: 0%  Prior authorization is not requested per Shann Medal.   Call Reference #: [id number]

## 2022-04-15 DIAGNOSIS — R1010 Upper abdominal pain, unspecified: Secondary | ICD-10-CM | POA: Diagnosis not present

## 2022-04-17 DIAGNOSIS — M940 Chondrocostal junction syndrome [Tietze]: Secondary | ICD-10-CM | POA: Diagnosis not present

## 2022-04-20 MED ORDER — HYDROCODONE-ACETAMINOPHEN 10-325 MG PO TABS: 1.0000 | ORAL_TABLET | Freq: Three times a day (TID) | ORAL | 0 refills | Status: DC | PRN

## 2022-04-20 NOTE — Addendum Note (Signed)
Addended by: Wallene Huh on: 04/20/2022 05:14 PM   Modules accepted: Orders

## 2022-04-21 ENCOUNTER — Telehealth: Payer: Self-pay

## 2022-04-21 ENCOUNTER — Encounter: Payer: Self-pay | Admitting: Podiatry

## 2022-04-21 DIAGNOSIS — M2041 Other hammer toe(s) (acquired), right foot: Secondary | ICD-10-CM | POA: Diagnosis not present

## 2022-04-21 DIAGNOSIS — M205X1 Other deformities of toe(s) (acquired), right foot: Secondary | ICD-10-CM | POA: Diagnosis not present

## 2022-04-21 DIAGNOSIS — M21541 Acquired clubfoot, right foot: Secondary | ICD-10-CM | POA: Diagnosis not present

## 2022-04-21 MED ORDER — HYDROCODONE-ACETAMINOPHEN 5-325 MG PO TABS: 1.0000 | ORAL_TABLET | Freq: Three times a day (TID) | ORAL | 0 refills | Status: AC | PRN

## 2022-04-21 NOTE — Telephone Encounter (Signed)
Patient is aware 

## 2022-04-29 ENCOUNTER — Ambulatory Visit (INDEPENDENT_AMBULATORY_CARE_PROVIDER_SITE_OTHER): Payer: BC Managed Care – PPO

## 2022-04-29 ENCOUNTER — Ambulatory Visit (INDEPENDENT_AMBULATORY_CARE_PROVIDER_SITE_OTHER): Payer: BC Managed Care – PPO | Admitting: Podiatry

## 2022-04-29 DIAGNOSIS — Z09 Encounter for follow-up examination after completed treatment for conditions other than malignant neoplasm: Secondary | ICD-10-CM | POA: Diagnosis not present

## 2022-04-29 NOTE — Progress Notes (Signed)
Patient seen at the office for POV #1 DOS 04/21/2022 Hammertoe Repair 2nd RT, Metatarsal Osteotomy 2nd RT. Patient denies any fever, nausea, vomiting, chills, chest pain. Pain is minimal at this time. Xrays and incision site was assessed by Dr. Paulla Dolly. Pin intact at this time. Patient is able to slowly transition into a surgical shoe but still use the cam boot for long walks. Foot redressed with a coban wrap and surgical shoe was dispensed today.   All questions were answered by Dr. Paulla Dolly.   Patient was advised to contact office with any concerns or questions.

## 2022-05-07 ENCOUNTER — Other Ambulatory Visit: Payer: Self-pay | Admitting: Podiatry

## 2022-05-07 ENCOUNTER — Telehealth: Payer: Self-pay | Admitting: *Deleted

## 2022-05-07 MED ORDER — MELOXICAM 15 MG PO TABS: 15.0000 mg | ORAL_TABLET | Freq: Every day | ORAL | 2 refills | Status: DC

## 2022-05-07 NOTE — Telephone Encounter (Signed)
Put her on a one a day anti inflammatory

## 2022-05-07 NOTE — Telephone Encounter (Signed)
Patient is calling ,has been using a body pillow at night, has stopped but now having swelling around the knee, inside of the thigh, has been icing behind the knee and using icy hot, would like other recommendations on if she can ice the foot, something  else to elevate the swelling, discomfort. Please advise.

## 2022-05-07 NOTE — Telephone Encounter (Signed)
Patient has been updated.

## 2022-05-13 ENCOUNTER — Other Ambulatory Visit: Payer: BC Managed Care – PPO

## 2022-05-27 ENCOUNTER — Ambulatory Visit (INDEPENDENT_AMBULATORY_CARE_PROVIDER_SITE_OTHER): Payer: BC Managed Care – PPO | Admitting: Podiatry

## 2022-05-27 ENCOUNTER — Ambulatory Visit (INDEPENDENT_AMBULATORY_CARE_PROVIDER_SITE_OTHER): Payer: BC Managed Care – PPO

## 2022-05-27 DIAGNOSIS — T81509A Unspecified complication of foreign body accidentally left in body following unspecified procedure, initial encounter: Secondary | ICD-10-CM | POA: Diagnosis not present

## 2022-05-27 DIAGNOSIS — M79671 Pain in right foot: Secondary | ICD-10-CM | POA: Diagnosis not present

## 2022-05-27 NOTE — Progress Notes (Signed)
Subjective:   Patient ID: Christy Powers, female   DOB: 62 y.o.   MRN: [id number]   HPI Patient states doing well with minimal discomfort and states pin has been doing good excited to have it removed   ROS      Objective:  Physical Exam  Ocular status intact negative Bevelyn Buckles' sign noted wound edges well coapted good alignment of the second toe with excellent position noted     Assessment:  Doing well post digital fusion shortening osteotomy right     Plan:  X-rays reviewed pin removed second digit sterile dressing applied dispensed ankle compression stocking increase activity levels gradual return to soft shoe gear reappoint 4 weeks or earlier if necessary  X-rays indicate that the position of the second toe segment tarsal looks good the segment tarsal slightly sore but I do not think would be any issue with this and we will keep an eye on with screw pin and alignment

## 2022-06-19 DIAGNOSIS — Z124 Encounter for screening for malignant neoplasm of cervix: Secondary | ICD-10-CM | POA: Diagnosis not present

## 2022-06-19 DIAGNOSIS — Z01419 Encounter for gynecological examination (general) (routine) without abnormal findings: Secondary | ICD-10-CM | POA: Diagnosis not present

## 2022-06-19 DIAGNOSIS — Z6828 Body mass index (BMI) 28.0-28.9, adult: Secondary | ICD-10-CM | POA: Diagnosis not present

## 2022-06-19 DIAGNOSIS — Z1231 Encounter for screening mammogram for malignant neoplasm of breast: Secondary | ICD-10-CM | POA: Diagnosis not present

## 2022-06-19 DIAGNOSIS — Z1151 Encounter for screening for human papillomavirus (HPV): Secondary | ICD-10-CM | POA: Diagnosis not present

## 2022-06-24 ENCOUNTER — Ambulatory Visit (INDEPENDENT_AMBULATORY_CARE_PROVIDER_SITE_OTHER): Payer: BC Managed Care – PPO

## 2022-06-24 ENCOUNTER — Encounter: Payer: Self-pay | Admitting: Podiatry

## 2022-06-24 ENCOUNTER — Ambulatory Visit (INDEPENDENT_AMBULATORY_CARE_PROVIDER_SITE_OTHER): Payer: BC Managed Care – PPO | Admitting: Podiatry

## 2022-06-24 DIAGNOSIS — M2041 Other hammer toe(s) (acquired), right foot: Secondary | ICD-10-CM

## 2022-06-24 NOTE — Progress Notes (Signed)
Subjective:   Patient ID: Christy Powers, female   DOB: 62 y.o.   MRN: [id number]   HPI Patient states she seems to be getting better and states that she still is not very good weight on her foot   ROS      Objective:  Physical Exam  Neurovascular status intact negative Bevelyn Buckles' sign noted second metatarsal second digit healing well crusted over some stiffness of the second MPJ and she has not been active with bending the toe     Assessment:  Appears to be an inflammatory condition with patient who has not been active in working the second digit MPJ joint at this time but overall healing well good alignment noted     Plan:  H&P x-ray reviewed discussed the toe is slightly elevated and encouraged her at this point to really increase activity levels and movement exercises.  Offered physical therapy she denies wanting this patient will be seen back in 8 weeks earlier if needed and will return to all normal shoes and encouraged to walk on her forefoot  X-rays indicate that there is good alignment of the second digit slight elevation on the lateral view with second metatarsal healing well screw in place

## 2022-06-26 ENCOUNTER — Encounter: Payer: Self-pay | Admitting: Cardiovascular Disease

## 2022-06-26 ENCOUNTER — Ambulatory Visit: Payer: BC Managed Care – PPO | Attending: Cardiovascular Disease | Admitting: Cardiovascular Disease

## 2022-06-26 ENCOUNTER — Telehealth: Payer: Self-pay | Admitting: Cardiovascular Disease

## 2022-06-26 VITALS — BP 145/105 | HR 77 | Ht 64.5 in | Wt 165.8 lb

## 2022-06-26 DIAGNOSIS — E782 Mixed hyperlipidemia: Secondary | ICD-10-CM

## 2022-06-26 DIAGNOSIS — Z79899 Other long term (current) drug therapy: Secondary | ICD-10-CM | POA: Diagnosis not present

## 2022-06-26 DIAGNOSIS — I10 Essential (primary) hypertension: Secondary | ICD-10-CM

## 2022-06-26 MED ORDER — DICLOFENAC SODIUM 1 % EX GEL: 4.0000 g | Freq: Four times a day (QID) | CUTANEOUS | Status: DC

## 2022-06-26 MED ORDER — POTASSIUM CHLORIDE ER 10 MEQ PO TBCR: 10.0000 meq | EXTENDED_RELEASE_TABLET | Freq: Every day | ORAL | 3 refills | Status: DC

## 2022-06-26 MED ORDER — NEBIVOLOL HCL 5 MG PO TABS: 5.0000 mg | ORAL_TABLET | Freq: Every day | ORAL | 3 refills | Status: DC

## 2022-06-26 MED ORDER — HYDROCHLOROTHIAZIDE 25 MG PO TABS: 25.0000 mg | ORAL_TABLET | Freq: Every day | ORAL | 3 refills | Status: DC

## 2022-06-26 NOTE — Patient Instructions (Signed)
Medication Instructions:  START Bystolic '5mg'$  daily (nebivolol) START Hydrochlorothiazide '25mg'$  daily START Potassium Chloride 18mq daily Voltaren gel OTC (4gm up to four times daily on affected area) STOP Meloxicam (mobic) *If you need a refill on your cardiac medications before your next appointment, please call your pharmacy*   Lab Work: BMET, Troponin T today BMET in March (next appt) If you have labs (blood work) drawn today and your tests are completely normal, you will receive your results only by: [place](if you have [place]Chart) OR A paper copy in the mail If you have any lab test that is abnormal or we need to change your treatment, we will call you to review the results.  Testing/Procedures: NONE  Follow-Up: [facility] you and your [facility] needs are our priority.  As part of our continuing mission to provide you with exceptional heart care, we have created designated Provider Care Teams.  These Care Teams include your primary Cardiologist (physician) and Advanced Practice Providers (APPs -  Physician Assistants and Nurse Practitioners) who all work together to provide you with the care you need, when you need it.  Your next appointment:   July 10, 2022 @ 1:40pm  Provider:   PMertie Moores MD

## 2022-06-26 NOTE — Telephone Encounter (Signed)
Patient calling states her BP is elevated and HR also  Pt c/o BP issue: STAT if pt c/o blurred vision, one-sided weakness or slurred speech  1. What are your last 5 BP readings? 196/114 never been this high HR 118 usually in 80's  2. Are you having any other symptoms (ex. Dizziness, headache, blurred vision, passed out)? no  3. What is your BP issue? Patient states her BP and HR are both very high this morning. She says she is not having any symptoms.

## 2022-06-26 NOTE — Telephone Encounter (Signed)
Call transferred to triage.  Patient reports BP this morning (2 hours after taking BP med): 196/114, HR 118.  Patient reports BP/HR trends are 140s-150s/80s-90s, HR 80s.  Patient denies any CP, SOB, headache. Reports she ate pizza last night. Patient also reports she has a trip to take this afternoon and she gets anxious when she has to drive anywhere. Asked patient if she had taken her Xanax, she states she has not. Patient took half tablet while on the phone, she states a whole tablet is too much for her and she would not be able to drive but felt OK to take a half dose.  Patient currently taking valsartan '160mg'$  QD.   Will forward to Dr. Elmarie Shiley nurse. Patient requests callback with advisement on cell phone: [phone].

## 2022-06-26 NOTE — Telephone Encounter (Signed)
Called and spoke with patient who at this time states BP is 169/112, HR 90. States BP at [facility] office in the last week was 150/90, does not check routinely at home. She denies all symptoms of headache, blurred vision, chest pain, SOB.  Questioned patient on any new supplements or stimulants. She condones beginning to consistently take "new Leaf natur[employer] CBD gummies" for sleep, but no other changes. I explained that these are not FDA regulated so no sure way to know what's actually in them. In any event, we need to see her and offered opening for 2:30 today, which she accepted. BP will be addressed during visit. She checked again at finale of call and BP was 163/109, HR 99.

## 2022-06-26 NOTE — Progress Notes (Signed)
Cardiology Office Note:    Date:  06/26/2022   ID:  Christy Powers, DOB [birth year], MRN [id number]  PCP:  Maury Dus, MD (Inactive)  [facility] Cardiologist:  Jordan Caraveo  [facility] Electrophysiologist:  None   Referring MD: No ref. provider found   Chief Complaint  Patient presents with   Hypertension        Hyperlipidemia    Previous notes:    Christy Powers is a 62 y.o. female with a hx of HTN.   She has a strong family hx of CAD.   We were asked to see her today by Dr. Alyson Ingles for further evaluation of her CV risk.  HTN for years , has been well controlled.   Both mother and father had premature heart disease / stroke . Has a hx of HLD - is on pravachol  Walks pm a regular basis - 1-2 miles a day .  No CP or dyspnea.   No syncope or presyncope   Non smoker  No complaints of any sort.   Just here to assess risk and get an understanding of how she can improve   Sept. 6, 2022 Christy Powers is here to follow up with her HTN and HLD.   Has a strong family hx of premature CAD Coronary calcium score of 24. This was 44 percentile for age and sex matched control. In Dec. 2021 LDL particle number was 663.   LDL = 76. Walks regularly   Sept 7, 2023   Christy Powers regularly  Father died of MI at age 70  Mother had strokes   Christy Powers is seen for new HTN Noted her BP to be fast   Woke up today with her heart racing  178/110  Is having some jaw tingling and tingling along the inside of her left arm   Is not eating much salt but did eat pizza last night  The pizza did not sound very salty   Has been on Meloxicam for her   foot surgery  She has been on meloxicam for 6 weeks.  She really does not check her blood pressure regularly but did decide to check it today because she felt poorly ( heart racing )   Has been on Lidocaine patches  Takes CBD gummies for sleep    Lots of indigestion today  Wt is 165 ( down 2 lbs ) Had foot surgery several months  Had 1 diet coke this am She has  a strong family history of coronary artery disease.   LP(a) on January 08, 2022 is 8.6.  Will check BMP  She had her Hb checked at gyn and she is not anemic Troponin  Past Medical History:  Diagnosis Date   Anxiety    Family history of cardiovascular disease     History reviewed. No pertinent surgical history.  Current Medications: Current Meds  Medication Sig   ALPRAZolam (XANAX) 0.25 MG tablet Take 0.25 mg by mouth at bedtime as needed for anxiety.   Coenzyme Q10 (CO Q 10) 10 MG CAPS Take by mouth.   diclofenac Sodium (VOLTAREN) 1 % GEL Apply 4 g topically 4 (four) times daily.   fluocinonide (LIDEX) 0.05 % external solution Apply 1 application topically 2 (two) times daily.   hydrochlorothiazide (HYDRODIURIL) 25 MG tablet Take 1 tablet (25 mg total) by mouth daily.   ketoconazole (NIZORAL) 2 % shampoo Apply 1 application topically 2 (two) times a week.   Multiple Vitamin (MULTIVITAMIN ADULT) TABS Take by mouth daily.  nebivolol (BYSTOLIC) 5 MG tablet Take 1 tablet (5 mg total) by mouth daily.   pantoprazole (PROTONIX) 40 MG tablet Take 40 mg by mouth every morning.   potassium chloride (KLOR-CON) 10 MEQ tablet Take 1 tablet (10 mEq total) by mouth daily.   rosuvastatin (CRESTOR) 20 MG tablet Take 1 tablet (20 mg total) by mouth daily.   TURMERIC PO Take by mouth.   valsartan (DIOVAN) 160 MG tablet Take 1 tablet (160 mg total) by mouth daily.   [DISCONTINUED] meloxicam (MOBIC) 15 MG tablet Take 1 tablet (15 mg total) by mouth daily.     Allergies:   Patient has no known allergies.   Social History   Socioeconomic History   Marital status: Married    Spouse name: Not on file   Number of children: Not on file   Years of education: Not on file   Highest education level: Not on file  Occupational History   Not on file  Tobacco Use   Smoking status: Never   Smokeless tobacco: Never  Substance and Sexual Activity   Alcohol use: Not on file   Drug use: Not on file    Sexual activity: Yes  Other Topics Concern   Not on file  Social History Narrative   Not on file   Social Determinants of Health   Financial Resource Strain: Not on file  Food Insecurity: Not on file  Transportation Needs: Not on file  Physical Activity: Not on file  Stress: Not on file  Social Connections: Not on file     Family History: The patient's family history includes CVA in her mother; Heart attack in her father; Heart disease (age of onset: 27) in her father; Hypercholesterolemia in her mother; Hypertension in her mother.  ROS:   Please see the history of present illness.     All other systems reviewed and are negative.  EKGs/Labs/Other Studies Reviewed:    The following studies were reviewed today:   EKG:     June 26, 2022: Normal sinus rhythm at 77.  Recent Labs: 01/08/2022: ALT 24; BUN 10; Creatinine, Ser 0.85; Potassium 4.2; Sodium 142  Recent Lipid Panel    Component Value Date/Time   CHOL 158 01/08/2022 0842   TRIG 57 01/08/2022 0842   HDL 93 01/08/2022 0842   CHOLHDL 1.7 01/08/2022 0842   LDLCALC 53 01/08/2022 0842    Physical Exam:    Physical Exam: Blood pressure (!) 145/105, pulse 77, height 5' 4.5" (1.638 m), weight 165 lb 12.8 oz (75.2 kg), SpO2 98 %.  HYPERTENSION CONTROL Vitals:   06/26/22 1451 06/26/22 1515  BP: (!) 178/110 (!) 145/105    The patient's blood pressure is elevated above target today.  In order to address the patient's elevated BP: A new medication was prescribed today.       GEN:  Well nourished, well developed in no acute distress HEENT: Normal NECK: No JVD; No carotid bruits LYMPHATICS: No lymphadenopathy CARDIAC: RRR , no murmurs, rubs, gallops RESPIRATORY:  Clear to auscultation without rales, wheezing or rhonchi  ABDOMEN: Soft, non-tender, non-distended MUSCULOSKELETAL:  No edema; No deformity  SKIN: Warm and dry NEUROLOGIC:  Alert and oriented x 3     ASSESSMENT:    1. Mixed hyperlipidemia    2. Primary hypertension   3. Medication management       PLAN:      Hyperlipidemia: LDL is 74.  She has a strong family history of coronary artery disease.  She also  has a coronary calcium score in the 20s.  I would like for her LDL to be 55.  We will also measure a lipoprotein a.      2.  Essential Hypertension:    Blood pressure is very elevated today.  I have a suspicion that her blood pressure has been high due to her meloxicam and diclofenac therapy but she did not check it until today.  Now her blood pressure is markedly elevated and this is caused some degree of anxiety.  Will have her discontinue the oral meloxicam.  Will give her diclofenac gel over-the-counter to take as directed for her postsurgical inflammation.  Will start her on Bystolic 5 mg a day, HCTZ 25 mg a day, potassium chloride 10 mill equivalents a day.  I will see her back on March 8 for an office visit and basic metabolic check.  She is having some unusual tingling in her jaw and her left arm.  Will check a troponin level today.  Her EKG does not show any acute abnormalities. .  3.  Family history of CAD and stroke   4.  Hyperglycemia:      Medication Adjustments/Labs and Tests Ordered: Current medicines are reviewed at length with the patient today.  Concerns regarding medicines are outlined above.  Orders Placed This Encounter  Procedures   Basic metabolic panel   Troponin T   Basic metabolic panel   EKG [id number]    Meds ordered this encounter  Medications   nebivolol (BYSTOLIC) 5 MG tablet    Sig: Take 1 tablet (5 mg total) by mouth daily.    Dispense:  90 tablet    Refill:  3   hydrochlorothiazide (HYDRODIURIL) 25 MG tablet    Sig: Take 1 tablet (25 mg total) by mouth daily.    Dispense:  90 tablet    Refill:  3   potassium chloride (KLOR-CON) 10 MEQ tablet    Sig: Take 1 tablet (10 mEq total) by mouth daily.    Dispense:  90 tablet    Refill:  3   diclofenac Sodium (VOLTAREN) 1 %  GEL    Sig: Apply 4 g topically 4 (four) times daily.    Dispense:  100 g    I will see her again in 1 year.  Patient Instructions  Medication Instructions:  START Bystolic '5mg'$  daily (nebivolol) START Hydrochlorothiazide '25mg'$  daily START Potassium Chloride 31mq daily Voltaren gel OTC (4gm up to four times daily on affected area) STOP Meloxicam (mobic) *If you need a refill on your cardiac medications before your next appointment, please call your pharmacy*   Lab Work: BMET, Troponin T today BMET in March (next appt) If you have labs (blood work) drawn today and your tests are completely normal, you will receive your results only by: [place](if you have MyChart) OR A paper copy in the mail If you have any lab test that is abnormal or we need to change your treatment, we will call you to review the results.  Testing/Procedures: NONE  Follow-Up: [facility] you and your health needs are our priority.  As part of our continuing mission to provide you with exceptional heart care, we have created designated Provider Care Teams.  These Care Teams include your primary Cardiologist (physician) and Advanced Practice Providers (APPs -  Physician Assistants and Nurse Practitioners) who all work together to provide you with the care you need, when you need it.  Your next appointment:   July 10, 2022 @ 1:40pm  Provider:   Mertie Moores, MD        Signed, Mertie Moores, MD  06/26/2022 5:08 PM    [place]

## 2022-06-27 LAB — BASIC METABOLIC PANEL
BUN/Creatinine Ratio: 15 (ref 12–28)
BUN: 13 mg/dL (ref 8–27)
CO2: 24 mmol/L (ref 20–29)
Calcium: 10.2 mg/dL (ref 8.7–10.3)
Chloride: 101 mmol/L (ref 96–106)
Creatinine, Ser: 0.87 mg/dL (ref 0.57–1.00)
Glucose: 103 mg/dL — ABNORMAL HIGH (ref 70–99)
Potassium: 4 mmol/L (ref 3.5–5.2)
Sodium: 141 mmol/L (ref 134–144)
eGFR: 75 mL/min/{1.73_m2} (ref >59)

## 2022-06-27 LAB — TROPONIN T: Troponin T (Highly Sensitive): 10 ng/L (ref 0–14)

## 2022-07-09 NOTE — Progress Notes (Signed)
Cardiology Office Note:    Date:  07/11/2022   ID:  Christy Powers, DOB [birth year], MRN [id number]  PCP:  Maury Dus, MD (Inactive)  [facility] Cardiologist:  Tricha Ruggirello  [facility] Electrophysiologist:  None   Referring MD: No ref. provider found   Chief Complaint  Patient presents with   Hypertension        Hyperlipidemia    Previous notes:    Christy Powers is a 62 y.o. female with a hx of HTN.   She has a strong family hx of CAD.   We were asked to see her today by Dr. Alyson Ingles for further evaluation of her CV risk.  HTN for years , has been well controlled.   Both mother and father had premature heart disease / stroke . Has a hx of HLD - is on pravachol  Walks pm a regular basis - 1-2 miles a day .  No CP or dyspnea.   No syncope or presyncope   Non smoker  No complaints of any sort.   Just here to assess risk and get an understanding of how she can improve   Sept. 6, 2022 Christy Powers is here to follow up with her HTN and HLD.   Has a strong family hx of premature CAD Coronary calcium score of 24. This was 66 percentile for age and sex matched control. In Dec. 2021 LDL particle number was 663.   LDL = 76. Walks regularly   Sept 7, 2023   Christy Powers regularly  Father died of MI at age 38  Mother had strokes   Christy Powers is seen for new HTN Noted her BP to be fast   Woke up today with her heart racing  178/110  Is having some jaw tingling and tingling along the inside of her left arm   Is not eating much salt but did eat pizza last night  The pizza did not sound very salty   Has been on Meloxicam for her   foot surgery  She has been on meloxicam for 6 weeks.  She really does not check her blood pressure regularly but did decide to check it today because she felt poorly ( heart racing )   Has been on Lidocaine patches  Takes CBD gummies for sleep    Lots of indigestion today  Wt is 165 ( down 2 lbs ) Had foot surgery several months  Had 1 diet coke this am She has a  strong family history of coronary artery disease.   LP(a) on January 08, 2022 is 8.6.  Will check BMP  She had her Hb checked at gyn and she is not anemic Troponin   July 10, 2022 Christy Powers is seen for follow up of her chest pain , HTN She had markedly elevated last week.   She was on moloxicam  We stopped the meloxicam and gave her Voltaren gel.  We started Bystolic 5 mg a day, HCTZ 25 mg a day, potassium chloride once a day.  She now returns with a very normal/low blood pressure.  She is feeling quite a bit better.  We will be discontinuing the HCTZ and potassium for now.  She will keep these on hand.  We may need to restart them back on an alternate day schedule or perhaps half dose at some point if her blood pressure increases.  She will continue to use the Voltaren gel and hold off on using meloxicam as much as she can.  We discussed a coronary  calcium score from September, 2021.  Her coronary calcium score was 24 which places her in the 80th percentile for age and sex matched controls  LP(a)  is 8.4 ( normal)  Last lipid levels from Sept. 2023 LDL is 53 Chol = 158 HDL = 93 Trigs = 57      Past Medical History:  Diagnosis Date   Anxiety    Family history of cardiovascular disease     History reviewed. No pertinent surgical history.  Current Medications: No outpatient medications have been marked as taking for the 07/10/22 encounter (Office Visit) with Broderic Bara, Wonda Cheng, MD.     Allergies:   Patient has no known allergies.   Social History   Socioeconomic History   Marital status: Married    Spouse name: Not on file   Number of children: Not on file   Years of education: Not on file   Highest education level: Not on file  Occupational History   Not on file  Tobacco Use   Smoking status: Never   Smokeless tobacco: Never  Substance and Sexual Activity   Alcohol use: Not on file   Drug use: Not on file   Sexual activity: Yes  Other Topics Concern   Not on file   Social History Narrative   Not on file   Social Determinants of Health   Financial Resource Strain: Not on file  Food Insecurity: Not on file  Transportation Needs: Not on file  Physical Activity: Not on file  Stress: Not on file  Social Connections: Not on file     Family History: The patient's family history includes CVA in her mother; Heart attack in her father; Heart disease (age of onset: 43) in her father; Hypercholesterolemia in her mother; Hypertension in her mother.  ROS:   Please see the history of present illness.     All other systems reviewed and are negative.  EKGs/Labs/Other Studies Reviewed:    The following studies were reviewed today:   EKG:      Recent Labs: 01/08/2022: ALT 24 06/26/2022: BUN 13; Creatinine, Ser 0.87; Potassium 4.0; Sodium 141  Recent Lipid Panel    Component Value Date/Time   CHOL 158 01/08/2022 0842   TRIG 57 01/08/2022 0842   HDL 93 01/08/2022 0842   CHOLHDL 1.7 01/08/2022 0842   LDLCALC 53 01/08/2022 0842    Physical Exam:    Physical Exam: Blood pressure 104/64, pulse 74, height 5' 4.5" (1.638 m), weight 162 lb 6.4 oz (73.7 kg), SpO2 98 %.       GEN:  Well nourished, well developed in no acute distress HEENT: Normal NECK: No JVD; No carotid bruits LYMPHATICS: No lymphadenopathy CARDIAC: RRR , soft systolic murmur  RESPIRATORY:  Clear to auscultation without rales, wheezing or rhonchi  ABDOMEN: Soft, non-tender, non-distended MUSCULOSKELETAL:  No edema; No deformity  SKIN: Warm and dry NEUROLOGIC:  Alert and oriented x 3     ASSESSMENT:    1. Primary hypertension   2. Coronary artery disease involving native coronary artery of native heart without angina pectoris        PLAN:      Hyperlipidemia:  her last LDL is 53.   LP(a) is normal CAC score is 24. At this point, she is at goal,  no symptoms to suggest ischemic heart disease Will cont rosuvastatin 20 mg a day , low fat, low chol diet.     2.   Essential Hypertension: Needed had a markedly elevated blood  pressure at her last visit.  She stopped her Voltaren tablets.  She started using Voltaren gel.  We started her on Bystolic 5 mg a day, hydrochlorothiazide 25 mg a day, potassium chloride once a day.  Her blood pressure is now normal.  We will discontinue the HCTZ and potassium.  Continue Bystolic.  I will see her again in approximately 1 month on April 11.  She will continue to monitor her blood pressure on a regular basis and let us know if she starts to have any variations.  3.  Coronary artery calcification: She had a coronary calcium score of 24 in 2021.  Her lipoprotein a is normal at 8.6.  Her LDL is 53.  She currently is at goal.  I do not think she needs any additional medications at this point.    .  3.  Family history of CAD and stroke   4.  Hyperglycemia:      Medication Adjustments/Labs and Tests Ordered: Current medicines are reviewed at length with the patient today.  Concerns regarding medicines are outlined above.  No orders of the defined types were placed in this encounter.   No orders of the defined types were placed in this encounter.   I will see her again in 1 year.  Patient Instructions  Medication Instructions:  STOP Hydrochlorothiazide STOP Potassium Chloride *If you need a refill on your cardiac medications before your next appointment, please call your pharmacy*   Lab Work: NONE If you have labs (blood work) drawn today and your tests are completely normal, you will receive your results only by: [place] (if you have MyChart) OR A paper copy in the mail If you have any lab test that is abnormal or we need to change your treatment, we will call you to review the results.   Testing/Procedures: NONE   Follow-Up: At [facility], you and your health needs are our priority.  As part of our continuing mission to provide you with exceptional heart care, we have created  designated Provider Care Teams.  These Care Teams include your primary Cardiologist (physician) and Advanced Practice Providers (APPs -  Physician Assistants and Nurse Practitioners) who all work together to provide you with the care you need, when you need it.  We recommend signing up for the patient portal called "MyChart".  Sign up information is provided on this After Visit Summary.  MyChart is used to connect with patients for Virtual Visits (Telemedicine).  Patients are able to view lab/test results, encounter notes, upcoming appointments, etc.  Non-urgent messages can be sent to your provider as well.   To learn more about what you can do with MyChart, go to [email or web address].    Your next appointment:   As scheduled  Provider:   Mertie Moores, MD        Signed, Mertie Moores, MD  07/11/2022 7:25 AM    [place]

## 2022-07-10 ENCOUNTER — Ambulatory Visit: Payer: BC Managed Care – PPO

## 2022-07-10 ENCOUNTER — Ambulatory Visit: Payer: BC Managed Care – PPO | Attending: Cardiovascular Disease | Admitting: Cardiovascular Disease

## 2022-07-10 ENCOUNTER — Encounter: Payer: Self-pay | Admitting: Cardiovascular Disease

## 2022-07-10 VITALS — BP 104/64 | HR 74 | Ht 64.5 in | Wt 162.4 lb

## 2022-07-10 DIAGNOSIS — I2510 Atherosclerotic heart disease of native coronary artery without angina pectoris: Secondary | ICD-10-CM

## 2022-07-10 DIAGNOSIS — I10 Essential (primary) hypertension: Secondary | ICD-10-CM

## 2022-07-10 NOTE — Patient Instructions (Signed)
Medication Instructions:  STOP Hydrochlorothiazide STOP Potassium Chloride *If you need a refill on your cardiac medications before your next appointment, please call your pharmacy*   Lab Work: NONE If you have labs (blood work) drawn today and your tests are completely normal, you will receive your results only by: [place] (if you have MyChart) OR A paper copy in the mail If you have any lab test that is abnormal or we need to change your treatment, we will call you to review the results.   Testing/Procedures: NONE   Follow-Up: At [facility], you and your health needs are our priority.  As part of our continuing mission to provide you with exceptional heart care, we have created designated Provider Care Teams.  These Care Teams include your primary Cardiologist (physician) and Advanced Practice Providers (APPs -  Physician Assistants and Nurse Practitioners) who all work together to provide you with the care you need, when you need it.  We recommend signing up for the patient portal called "MyChart".  Sign up information is provided on this After Visit Summary.  MyChart is used to connect with patients for Virtual Visits (Telemedicine).  Patients are able to view lab/test results, encounter notes, upcoming appointments, etc.  Non-urgent messages can be sent to your provider as well.   To learn more about what you can do with MyChart, go to [email or web address].    Your next appointment:   As scheduled  Provider:   Mertie Moores, MD

## 2022-08-12 ENCOUNTER — Encounter: Payer: Self-pay | Admitting: Cardiovascular Disease

## 2022-08-12 NOTE — Progress Notes (Unsigned)
Cardiology Office Note:    Date:  08/13/2022   ID:  Christy Powers, DOB 03-Jun-1960, MRN [id number]  PCP:  Elias Else, MD (Inactive)  [facility] Cardiologist:  Malcom Selmer  [facility] Electrophysiologist:  None   Referring MD: No ref. provider found   Chief Complaint  Patient presents with   Hypertension        Hyperlipidemia    Previous notes:    Christy Powers is a 62 y.o. female with a hx of HTN.   She has a strong family hx of CAD.   We were asked to see her today by Dr. Nicholos Johns for further evaluation of her CV risk.  HTN for years , has been well controlled.   Both mother and father had premature heart disease / stroke . Has a hx of HLD - is on pravachol  Walks pm a regular basis - 1-2 miles a day .  No CP or dyspnea.   No syncope or presyncope   Non smoker  No complaints of any sort.   Just here to assess risk and get an understanding of how she can improve   Sept. 6, 2022 Christy Powers is here to follow up with her HTN and HLD.   Has a strong family hx of premature CAD Coronary calcium score of 24. This was 24 percentile for age and sex matched control. In Dec. 2021 LDL particle number was 663.   LDL = 76. Walks regularly   Sept 7, 2023   Walks regularly  Father died of MI at age 74  Mother had strokes   Christy Powers is seen for new HTN Noted her BP to be fast   Woke up today with her heart racing  178/110  Is having some jaw tingling and tingling along the inside of her left arm   Is not eating much salt but did eat pizza last night  The pizza did not sound very salty   Has been on Meloxicam for her   foot surgery  She has been on meloxicam for 6 weeks.  She really does not check her blood pressure regularly but did decide to check it today because she felt poorly ( heart racing )   Has been on Lidocaine patches  Takes CBD gummies for sleep    Lots of indigestion today  Wt is 165 ( down 2 lbs ) Had foot surgery several months  Had 1 diet coke this am She has  a strong family history of coronary artery disease.   LP(a) on January 08, 2022 is 8.6.  Will check BMP  She had her Hb checked at gyn and she is not anemic Troponin   July 10, 2022 Christy Powers is seen for follow up of her chest pain , HTN She had markedly elevated last week.   She was on moloxicam  We stopped the meloxicam and gave her Voltaren gel.  We started Bystolic 5 mg a day, HCTZ 25 mg a day, potassium chloride once a day.  She now returns with a very normal/low blood pressure.  She is feeling quite a bit better.  We will be discontinuing the HCTZ and potassium for now.  She will keep these on hand.  We may need to restart them back on an alternate day schedule or perhaps half dose at some point if her blood pressure increases.  She will continue to use the Voltaren gel and hold off on using meloxicam as much as she can.  We discussed a coronary  calcium score from September, 2021.  Her coronary calcium score was 24 which places her in the 80th percentile for age and sex matched controls  LP(a)  is 8.4 ( normal)  Last lipid levels from Sept. 2023 LDL is 53 Chol = 158 HDL = 93 Trigs = 57  August 13, 2022 Christy Powers is seen for follow up of her CP, HTN, HLD  She was seen here a month or so ago with significant hypertension. We have started Bystolic and HCTZ along with her Valsartan  We have discontinued her Voltaren and started her on diclofenac gel. We have DC'd the HCTZ and kdur  BP remains well controlled.     Past Medical History:  Diagnosis Date   Anxiety    Family history of cardiovascular disease     History reviewed. No pertinent surgical history.  Current Medications: Current Meds  Medication Sig   ALPRAZolam (XANAX) 0.25 MG tablet Take 0.25 mg by mouth at bedtime as needed for anxiety.   Coenzyme Q10 (CO Q 10) 10 MG CAPS Take by mouth.   diclofenac Sodium (VOLTAREN) 1 % GEL Apply 4 g topically 4 (four) times daily.   fluocinonide (LIDEX) 0.05 % external solution  Apply 1 application topically 2 (two) times daily.   ketoconazole (NIZORAL) 2 % shampoo Apply 1 application topically 2 (two) times a week.   Multiple Vitamin (MULTIVITAMIN ADULT) TABS Take by mouth daily.   nebivolol (BYSTOLIC) 5 MG tablet Take 1 tablet (5 mg total) by mouth daily.   pantoprazole (PROTONIX) 40 MG tablet Take 40 mg by mouth every morning.   rosuvastatin (CRESTOR) 20 MG tablet Take 1 tablet (20 mg total) by mouth daily.   valsartan (DIOVAN) 160 MG tablet Take 1 tablet (160 mg total) by mouth daily.     Allergies:   Patient has no known allergies.   Social History   Socioeconomic History   Marital status: Married    Spouse name: Not on file   Number of children: Not on file   Years of education: Not on file   Highest education level: Not on file  Occupational History   Not on file  Tobacco Use   Smoking status: Never   Smokeless tobacco: Never  Substance and Sexual Activity   Alcohol use: Not on file   Drug use: Not on file   Sexual activity: Yes  Other Topics Concern   Not on file  Social History Narrative   Not on file   Social Determinants of Health   Financial Resource Strain: Not on file  Food Insecurity: Not on file  Transportation Needs: Not on file  Physical Activity: Not on file  Stress: Not on file  Social Connections: Not on file     Family History: The patient's family history includes CVA in her mother; Heart attack in her father; Heart disease (age of onset: 19) in her father; Hypercholesterolemia in her mother; Hypertension in her mother.  ROS:   Please see the history of present illness.     All other systems reviewed and are negative.  EKGs/Labs/Other Studies Reviewed:    The following studies were reviewed today:   EKG:      Recent Labs: 01/08/2022: ALT 24 06/26/2022: BUN 13; Creatinine, Ser 0.87; Potassium 4.0; Sodium 141  Recent Lipid Panel    Component Value Date/Time   CHOL 158 01/08/2022 0842   TRIG 57 01/08/2022 0842    HDL 93 01/08/2022 0842   CHOLHDL 1.7 01/08/2022 2751  LDLCALC 53 01/08/2022 0842    Physical Exam:    Physical Exam: Blood pressure 125/78, pulse 68, height 5' 4.5" (1.638 m), weight 166 lb 6.4 oz (75.5 kg), SpO2 98 %.       GEN:  Well nourished, well developed in no acute distress HEENT: Normal NECK: No JVD; No carotid bruits LYMPHATICS: No lymphadenopathy CARDIAC: RRR , no murmurs, rubs, gallops RESPIRATORY:  Clear to auscultation without rales, wheezing or rhonchi  ABDOMEN: Soft, non-tender, non-distended MUSCULOSKELETAL:  No edema; No deformity  SKIN: Warm and dry NEUROLOGIC:  Alert and oriented x 3    ASSESSMENT:    1. Primary hypertension         PLAN:      Hyperlipidemia:  her last LDL is 53.   LP(a) is normal CAC score is 24.   2.  Essential Hypertension:   Has BP is well controlled.   3.  Coronary artery calcification: She had a coronary calcium score of 24 in 2021.  Her lipoprotein a is normal at 8.6.  Her LDL is 53.    .  3.  Family history of CAD and stroke   4.  Hyperglycemia:      Medication Adjustments/Labs and Tests Ordered: Current medicines are reviewed at length with the patient today.  Concerns regarding medicines are outlined above.  No orders of the defined types were placed in this encounter.   No orders of the defined types were placed in this encounter.   I will see her again in 1 year.  Patient Instructions  Medication Instructions:  Your physician recommends that you continue on your current medications as directed. Please refer to the Current Medication list given to you today. *If you need a refill on your cardiac medications before your next appointment, please call your pharmacy*   Follow-Up: At [facility], you and your health needs are our priority.  As part of our continuing mission to provide you with exceptional heart care, we have created designated Provider Care Teams.  These Care Teams include  your primary Cardiologist (physician) and Advanced Practice Providers (APPs -  Physician Assistants and Nurse Practitioners) who all work together to provide you with the care you need, when you need it.  We recommend signing up for the patient portal called "MyChart".  Sign up information is provided on this After Visit Summary.  MyChart is used to connect with patients for Virtual Visits (Telemedicine).  Patients are able to view lab/test results, encounter notes, upcoming appointments, etc.  Non-urgent messages can be sent to your provider as well.   To learn more about what you can do with MyChart, go to [email or web address].    Your next appointment:   Already scheduled in September   Provider:   Kristeen MissPhilip Skylen Spiering, MD    Signed, Kristeen MissPhilip Rykar Lebleu, MD  08/13/2022 11:59 AM    [facility]

## 2022-08-13 ENCOUNTER — Encounter: Payer: Self-pay | Admitting: Cardiovascular Disease

## 2022-08-13 ENCOUNTER — Ambulatory Visit: Payer: BC Managed Care – PPO | Attending: Cardiovascular Disease | Admitting: Cardiovascular Disease

## 2022-08-13 VITALS — BP 125/78 | HR 68 | Ht 64.5 in | Wt 166.4 lb

## 2022-08-13 DIAGNOSIS — I10 Essential (primary) hypertension: Secondary | ICD-10-CM

## 2022-08-13 NOTE — Patient Instructions (Signed)
Medication Instructions:  Your physician recommends that you continue on your current medications as directed. Please refer to the Current Medication list given to you today. *If you need a refill on your cardiac medications before your next appointment, please call your pharmacy*   Follow-Up: At [facility], you and your health needs are our priority.  As part of our continuing mission to provide you with exceptional heart care, we have created designated Provider Care Teams.  These Care Teams include your primary Cardiologist (physician) and Advanced Practice Providers (APPs -  Physician Assistants and Nurse Practitioners) who all work together to provide you with the care you need, when you need it.  We recommend signing up for the patient portal called "MyChart".  Sign up information is provided on this After Visit Summary.  MyChart is used to connect with patients for Virtual Visits (Telemedicine).  Patients are able to view lab/test results, encounter notes, upcoming appointments, etc.  Non-urgent messages can be sent to your provider as well.   To learn more about what you can do with MyChart, go to [email or web address].    Your next appointment:   Already scheduled in September   Provider:   Kristeen Miss, MD

## 2022-08-19 ENCOUNTER — Encounter: Payer: Self-pay | Admitting: Podiatry

## 2022-08-19 ENCOUNTER — Ambulatory Visit (INDEPENDENT_AMBULATORY_CARE_PROVIDER_SITE_OTHER): Payer: BC Managed Care – PPO | Admitting: Podiatry

## 2022-08-19 ENCOUNTER — Ambulatory Visit (INDEPENDENT_AMBULATORY_CARE_PROVIDER_SITE_OTHER): Payer: BC Managed Care – PPO

## 2022-08-19 DIAGNOSIS — M2041 Other hammer toe(s) (acquired), right foot: Secondary | ICD-10-CM | POA: Diagnosis not present

## 2022-08-19 NOTE — Progress Notes (Signed)
Subjective:   Patient ID: Christy Powers, female   DOB: 63 y.o.   MRN: [id number]   HPI Patient states overall doing very well just concerned with mobility of the digit no pain   ROS      Objective:  Physical Exam  Neurovascular status intact negative Denna Haggard' sign noted wound edges are coapted excellent still has mild swelling around the first second metatarsal with slight elevation of the toe and reduced motion but all her digits have reduced motions at the MPJ     Assessment:  Overall doing well slight restriction of motion second MPJ     Plan:  H&P x-rays reviewed and advised this patient on continued mobilization exercises and as the swelling continues to go down hopefully the toe go down slightly but it may stay somewhat elevated but should not be pathological.  Patient is very pleased she is discharged will be seen back as needed  X-rays indicate that the toe is in good alignment slightly elevated second metatarsal slightly short but I do not think it will be any issue and is in excellent alignment of the first metatarsal and I did explain someday and in her future she could require shortening osteotomy of the third metatarsal

## 2022-08-25 ENCOUNTER — Ambulatory Visit: Payer: BC Managed Care – PPO | Admitting: Cardiovascular Disease

## 2022-09-30 ENCOUNTER — Telehealth: Payer: Self-pay | Admitting: Podiatry

## 2022-09-30 NOTE — Telephone Encounter (Signed)
Patient called and left message on nurse line and is wanting to go to PT for her toe and would like to go to [employer]. She would like a referral. Please contact patient.

## 2022-12-06 ENCOUNTER — Other Ambulatory Visit: Payer: Self-pay | Admitting: Cardiovascular Disease

## 2022-12-13 IMAGING — US US ABDOMEN LIMITED
1 series · 14 of 25 positions shown · non-contrast
Comparison: CT August 25, 2016.

CLINICAL DATA: Intermittent right upper quadrant abdominal pain and
cramping x3 months

EXAM:
ULTRASOUND ABDOMEN LIMITED RIGHT UPPER QUADRANT

[Series 1: us abdomen limited · 0.23mm/px · 14 of 46 slices shown]
[im 1/46]
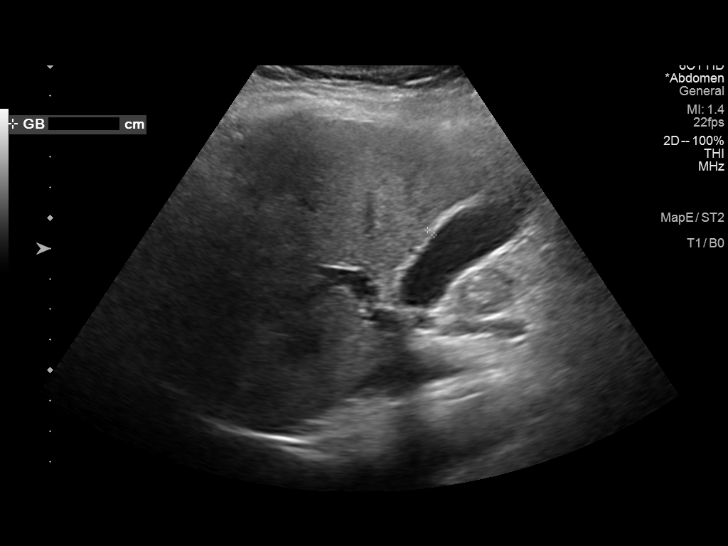
[im 4/46]
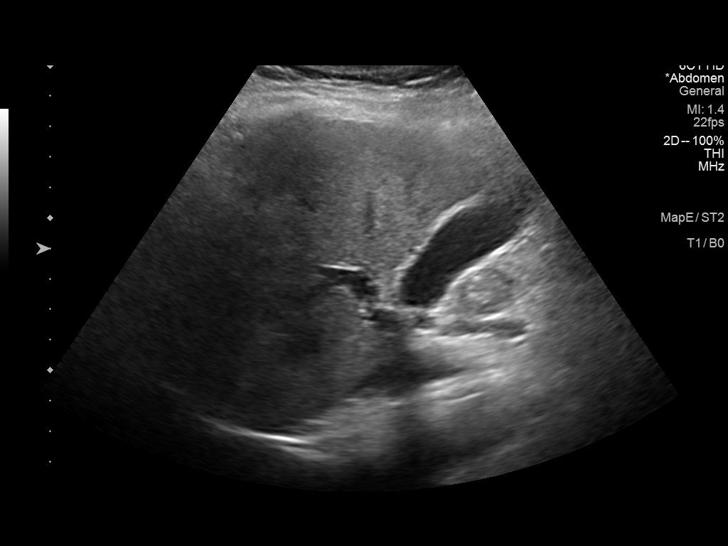
[im 8/46]
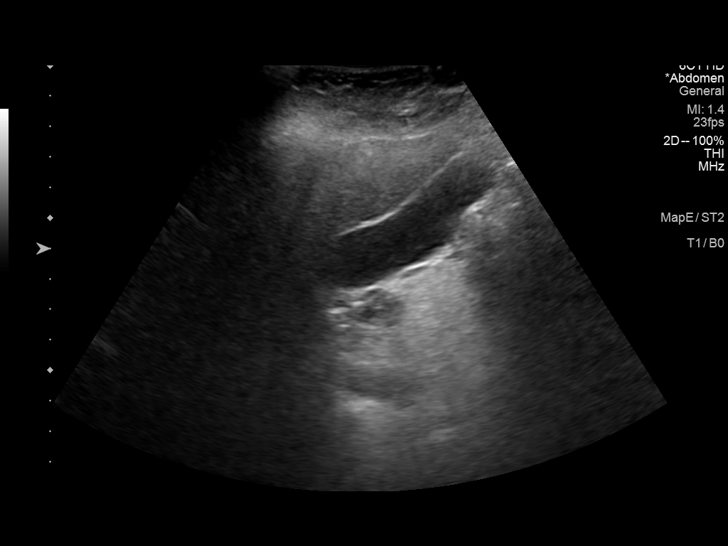
[im 12/46]
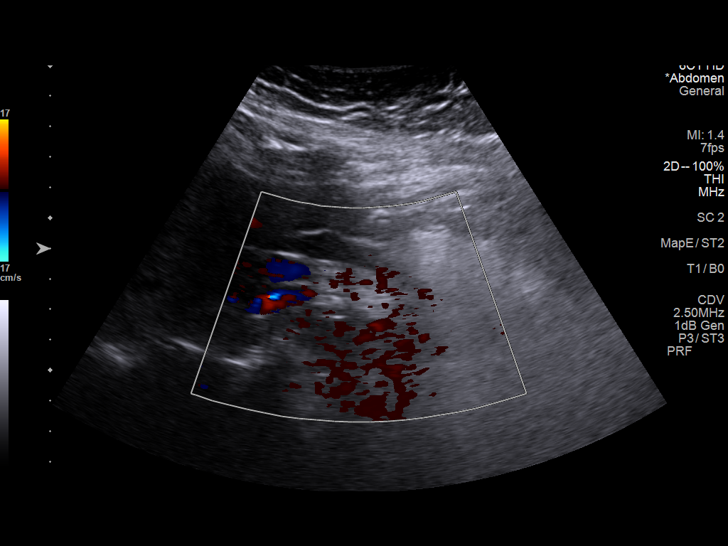
[im 16/46]
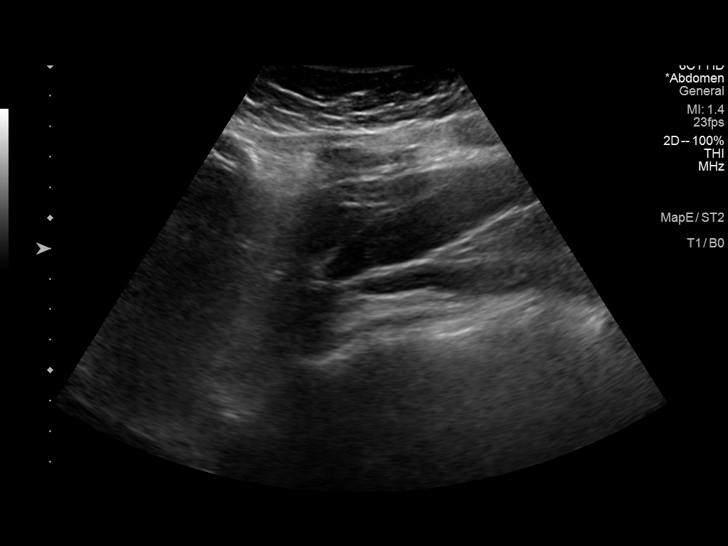
[im 17/46]
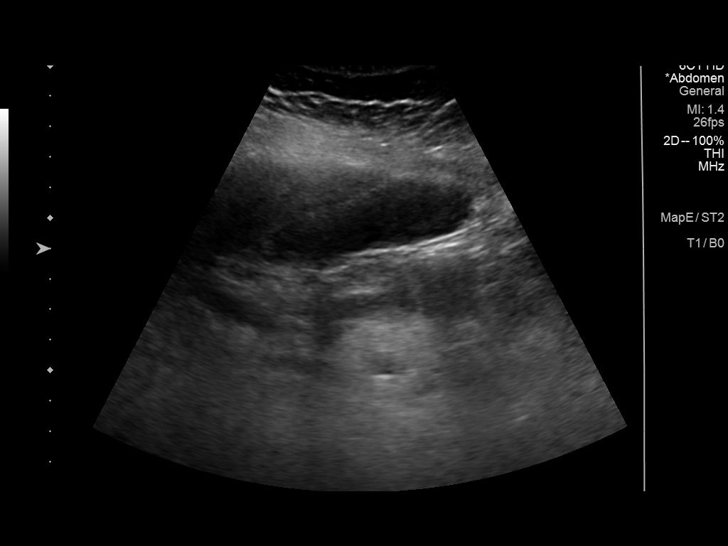
[im 21/46]
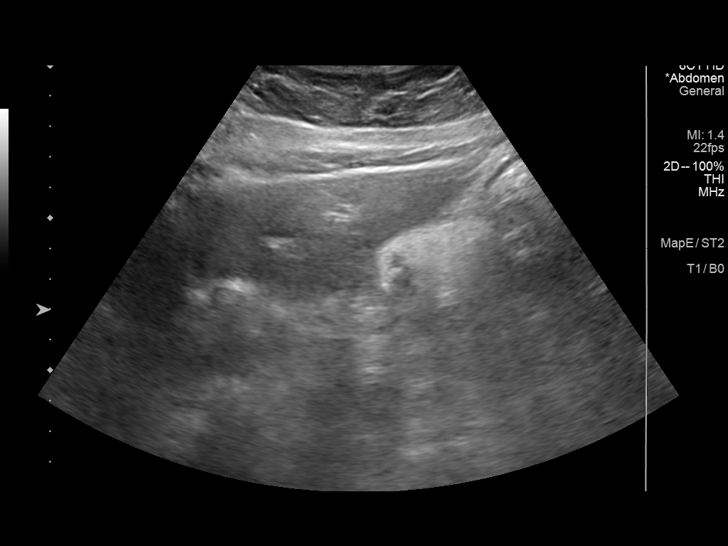
[im 25/46]
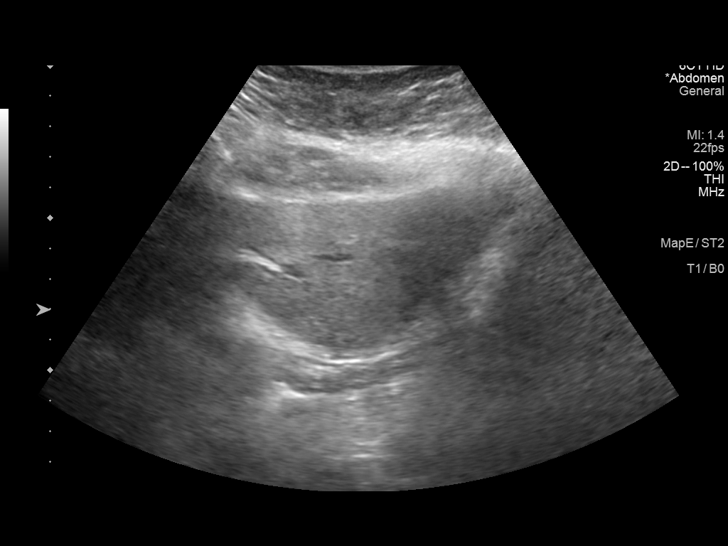
[im 29/46]
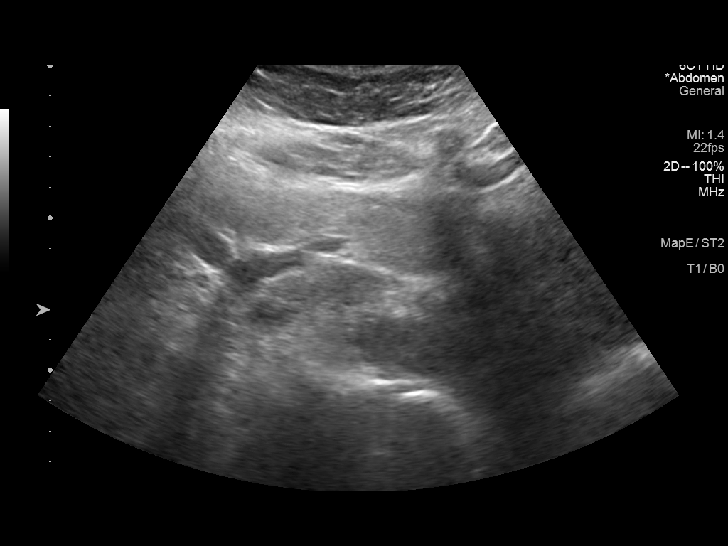
[im 31/46]
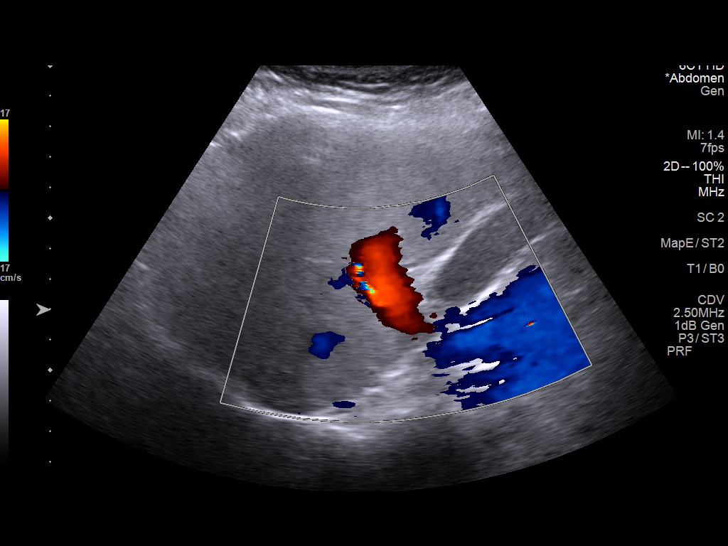
[im 34/46]
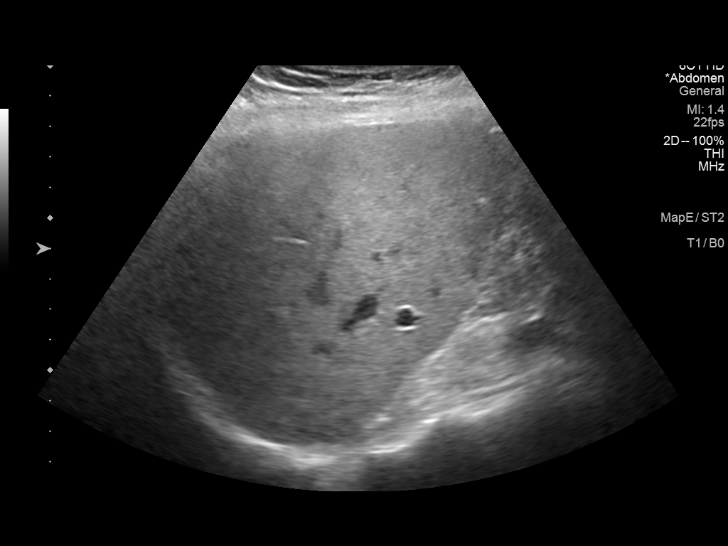
[im 38/46]
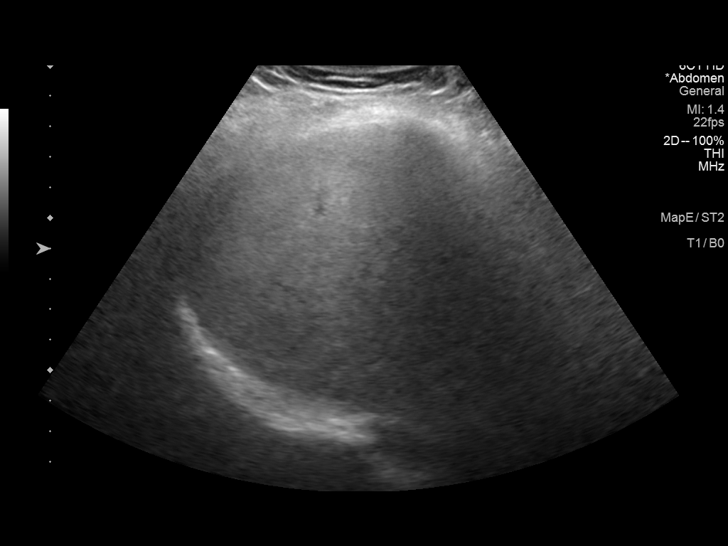
[im 42/46]
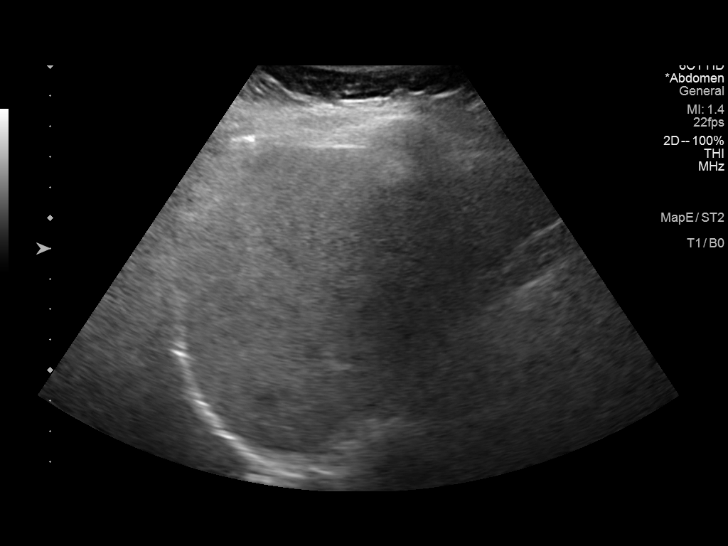
[im 46/46]
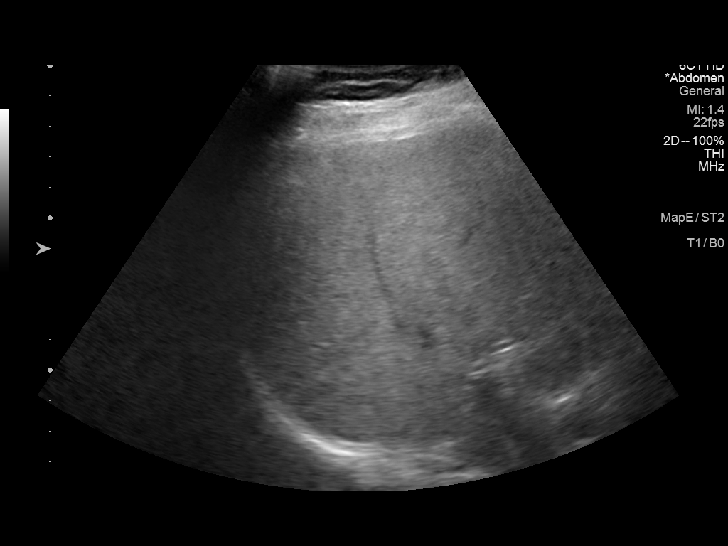

[14 of 25 positions shown; findings below may reference images not displayed]

FINDINGS: Gallbladder:

No gallstones or wall thickening visualized. No sonographic Murphy
sign noted by sonographer.

Common bile duct:

Diameter: 10 mm

Liver:

No focal lesion identified. Diffusely increased parenchymal
echogenicity. Portal vein is patent on color Doppler imaging with
normal direction of blood flow towards the liver.

Other: None.
IMPRESSION: 1. Dilated common bile duct measuring up to 10 mm. No obstructing
stone or mass is identified. Consider correlation with laboratory
values to assess for biliary obstruction, and if further clinical
concern for biliary obstruction consider further evaluation with
MRCP with and without contrast.
2. Diffusely increased parenchymal echogenicity, nonspecific but
most commonly seen with hepatic steatosis. No focal hepatic lesion
identified.

## 2023-01-04 ENCOUNTER — Other Ambulatory Visit: Payer: Self-pay | Admitting: Cardiovascular Disease

## 2023-01-05 ENCOUNTER — Encounter: Payer: Self-pay | Admitting: Cardiovascular Disease

## 2023-01-05 NOTE — Progress Notes (Signed)
Cardiology Office Note:    Date:  01/06/2023   ID:  Christy Powers, DOB 1960/06/24, MRN [id number]  PCP:  Deatra James, MD  [facility] Cardiologist:  Donovon Micheletti  [facility] Electrophysiologist:  None   Referring MD: No ref. provider found   Chief Complaint  Patient presents with   Hypertension        Hyperlipidemia    Previous notes:    Christy Powers is a 62 y.o. female with a hx of HTN.   She has a strong family hx of CAD.   We were asked to see her today by Dr. Nicholos Johns for further evaluation of her CV risk.  HTN for years , has been well controlled.   Both mother and father had premature heart disease / stroke . Has a hx of HLD - is on pravachol  Walks pm a regular basis - 1-2 miles a day .  No CP or dyspnea.   No syncope or presyncope   Non smoker  No complaints of any sort.   Just here to assess risk and get an understanding of how she can improve   Sept. 6, 2022 Christy Powers is here to follow up with her HTN and HLD.   Has a strong family hx of premature CAD Coronary calcium score of 24. This was 64 percentile for age and sex matched control. In Dec. 2021 LDL particle number was 663.   LDL = 76. Walks regularly   Sept 7, 2023   Walks regularly  Father died of MI at age 93  Mother had strokes   Christy Powers is seen for new HTN Noted her BP to be fast   Woke up today with her heart racing  178/110  Is having some jaw tingling and tingling along the inside of her left arm   Is not eating much salt but did eat pizza last night  The pizza did not sound very salty   Has been on Meloxicam for her   foot surgery  She has been on meloxicam for 6 weeks.  She really does not check her blood pressure regularly but did decide to check it today because she felt poorly ( heart racing )   Has been on Lidocaine patches  Takes CBD gummies for sleep    Lots of indigestion today  Wt is 165 ( down 2 lbs ) Had foot surgery several months  Had 1 diet coke this am She has a strong  family history of coronary artery disease.   LP(a) on January 08, 2022 is 8.6.  Will check BMP  She had her Hb checked at gyn and she is not anemic Troponin   July 10, 2022 Christy Powers is seen for follow up of her chest pain , HTN She had markedly elevated last week.   She was on moloxicam  We stopped the meloxicam and gave her Voltaren gel.  We started Bystolic 5 mg a day, HCTZ 25 mg a day, potassium chloride once a day.  She now returns with a very normal/low blood pressure.  She is feeling quite a bit better.  We will be discontinuing the HCTZ and potassium for now.  She will keep these on hand.  We may need to restart them back on an alternate day schedule or perhaps half dose at some point if her blood pressure increases.  She will continue to use the Voltaren gel and hold off on using meloxicam as much as she can.  We discussed a coronary calcium  score from September, 2021.  Her coronary calcium score was 24 which places her in the 80th percentile for age and sex matched controls  LP(a)  is 8.4 ( normal)  Last lipid levels from Sept. 2023 LDL is 53 Chol = 158 HDL = 93 Trigs = 57  August 13, 2022 Christy Powers is seen for follow up of her CP, HTN, HLD  She was seen here a month or so ago with significant hypertension. We have started Bystolic and HCTZ along with her Valsartan  We have discontinued her Voltaren and started her on diclofenac gel. We have DC'd the HCTZ and kdur  BP remains well controlled.    Sept. 4, 2024 Christy Powers is seen for follow up of her CP, HTN, HLD BP has been better controlled at home  [facility] regularly    Diastolic BP is mildly elevated  She wants to lose some weight       Past Medical History:  Diagnosis Date   Anxiety    Family history of cardiovascular disease     History reviewed. No pertinent surgical history.  Current Medications: Current Meds  Medication Sig   ALPRAZolam (XANAX) 0.25 MG tablet Take 0.25 mg by mouth at bedtime as needed for  anxiety.   Coenzyme Q10 (CO Q 10) 10 MG CAPS Take by mouth.   diclofenac Sodium (VOLTAREN) 1 % GEL Apply 4 g topically 4 (four) times daily.   fluocinonide (LIDEX) 0.05 % external solution Apply 1 application topically 2 (two) times daily.   hydrochlorothiazide (MICROZIDE) 12.5 MG capsule Take 1 capsule (12.5 mg total) by mouth daily.   ketoconazole (NIZORAL) 2 % shampoo Apply 1 application topically 2 (two) times a week.   Multiple Vitamin (MULTIVITAMIN ADULT) TABS Take by mouth daily.   pantoprazole (PROTONIX) 40 MG tablet Take 40 mg by mouth every morning.   potassium chloride (KLOR-CON) 10 MEQ tablet Take 1 tablet (10 mEq total) by mouth daily.   rosuvastatin (CRESTOR) 20 MG tablet Take 1 tablet (20 mg total) by mouth daily.   [DISCONTINUED] nebivolol (BYSTOLIC) 5 MG tablet Take 1 tablet (5 mg total) by mouth daily.   [DISCONTINUED] valsartan (DIOVAN) 160 MG tablet TAKE 1 TABLET BY MOUTH EVERY DAY     Allergies:   Patient has no known allergies.   Social History   Socioeconomic History   Marital status: Married    Spouse name: Not on file   Number of children: Not on file   Years of education: Not on file   Highest education level: Not on file  Occupational History   Not on file  Tobacco Use   Smoking status: Never   Smokeless tobacco: Never  Substance and Sexual Activity   Alcohol use: Not on file   Drug use: Not on file   Sexual activity: Yes  Other Topics Concern   Not on file  Social History Narrative   Not on file   Social Determinants of Health   Financial Resource Strain: Not on file  Food Insecurity: Not on file  Transportation Needs: Not on file  Physical Activity: Not on file  Stress: Not on file  Social Connections: Not on file     Family History: The patient's family history includes CVA in her mother; Heart attack in her father; Heart disease (age of onset: 50) in her father; Hypercholesterolemia in her mother; Hypertension in her mother.  ROS:    Please see the history of present illness.     All other systems  reviewed and are negative.  EKGs/Labs/Other Studies Reviewed:    The following studies were reviewed today:   EKG:      Recent Labs: 01/08/2022: ALT 24 06/26/2022: BUN 13; Creatinine, Ser 0.87; Potassium 4.0; Sodium 141  Recent Lipid Panel    Component Value Date/Time   CHOL 158 01/08/2022 0842   TRIG 57 01/08/2022 0842   HDL 93 01/08/2022 0842   CHOLHDL 1.7 01/08/2022 0842   LDLCALC 53 01/08/2022 0842    Physical Exam:     Physical Exam: Blood pressure (!) 124/90, pulse 66, height 5' 4.5" (1.638 m), weight 164 lb 6.4 oz (74.6 kg), SpO2 99%.  HYPERTENSION CONTROL Vitals:   01/06/23 0814 01/06/23 0836  BP: (!) 140/96 (!) 124/90    The patient's blood pressure is elevated above target today.  In order to address the patient's elevated BP: A new medication was prescribed today.       GEN:  Well nourished, well developed in no acute distress HEENT: Normal NECK: No JVD; No carotid bruits LYMPHATICS: No lymphadenopathy CARDIAC: RRR , no murmurs, rubs, gallops RESPIRATORY:  Clear to auscultation without rales, wheezing or rhonchi  ABDOMEN: Soft, non-tender, non-distended MUSCULOSKELETAL:  No edema; No deformity  SKIN: Warm and dry NEUROLOGIC:  Alert and oriented x 3    ASSESSMENT:    1. Primary hypertension   2. Mixed hyperlipidemia   3. Medication management   4. Coronary artery disease involving native coronary artery of native heart without angina pectoris          PLAN:      Hyperlipidemia:    check labs today   2.  Essential Hypertension:   diastolic BP is mildly elevated.   Add hydrochlorothiazide 12.5 a day , Kdur 10 meq a day     3.  Coronary artery calcification:    cont current meds  .  3.  Family history of CAD and stroke   4.  Hyperglycemia:  She will work on weight loss       Medication Adjustments/Labs and Tests Ordered: Current medicines are reviewed at  length with the patient today.  Concerns regarding medicines are outlined above.  Orders Placed This Encounter  Procedures   Basic metabolic panel   Lipid panel   ALT    Meds ordered this encounter  Medications   hydrochlorothiazide (MICROZIDE) 12.5 MG capsule    Sig: Take 1 capsule (12.5 mg total) by mouth daily.    Dispense:  90 capsule    Refill:  3   potassium chloride (KLOR-CON) 10 MEQ tablet    Sig: Take 1 tablet (10 mEq total) by mouth daily.    Dispense:  90 tablet    Refill:  3   valsartan (DIOVAN) 160 MG tablet    Sig: Take 1 tablet (160 mg total) by mouth daily.    Dispense:  90 tablet    Refill:  3   nebivolol (BYSTOLIC) 5 MG tablet    Sig: Take 1 tablet (5 mg total) by mouth daily.    Dispense:  90 tablet    Refill:  3    I will see her again in 3 months   Patient Instructions  Medication Instructions:  START Hydrochlorothiazide 12.5mg  daily START Potassium Chloride daily REFILLED Nebivolol and Valsartan *If you need a refill on your cardiac medications before your next appointment, please call your pharmacy*  Lab Work: Lipids, ALT today BMET in 3 weeks If you have labs (blood work) drawn today  and your tests are completely normal, you will receive your results only by: MyChart Message (if you have MyChart) OR A paper copy in the mail If you have any lab test that is abnormal or we need to change your treatment, we will call you to review the results.  Testing/Procedures: NONE  Follow-Up: At [facility], you and your health needs are our priority.  As part of our continuing mission to provide you with exceptional heart care, we have created designated Provider Care Teams.  These Care Teams include your primary Cardiologist (physician) and Advanced Practice Providers (APPs -  Physician Assistants and Nurse Practitioners) who all work together to provide you with the care you need, when you need it.   Your next appointment:   3  month(s)  Provider:   Kristeen Miss, MD        Signed, Kristeen Miss, MD  01/06/2023 8:44 AM    [facility]

## 2023-01-06 ENCOUNTER — Encounter: Payer: Self-pay | Admitting: Cardiovascular Disease

## 2023-01-06 ENCOUNTER — Ambulatory Visit: Payer: BC Managed Care – PPO | Attending: Cardiovascular Disease | Admitting: Cardiovascular Disease

## 2023-01-06 VITALS — BP 124/90 | HR 66 | Ht 64.5 in | Wt 164.4 lb

## 2023-01-06 DIAGNOSIS — Z79899 Other long term (current) drug therapy: Secondary | ICD-10-CM

## 2023-01-06 DIAGNOSIS — E782 Mixed hyperlipidemia: Secondary | ICD-10-CM

## 2023-01-06 DIAGNOSIS — I2510 Atherosclerotic heart disease of native coronary artery without angina pectoris: Secondary | ICD-10-CM | POA: Diagnosis not present

## 2023-01-06 DIAGNOSIS — I10 Essential (primary) hypertension: Secondary | ICD-10-CM | POA: Diagnosis not present

## 2023-01-06 LAB — ALT: ALT: 20 IU/L (ref 0–32)

## 2023-01-06 LAB — LIPID PANEL
Chol/HDL Ratio: 2.1 ratio (ref 0.0–4.4)
Cholesterol, Total: 173 mg/dL (ref 100–199)
HDL: 82 mg/dL (ref >39)
LDL Chol Calc (NIH): 73 mg/dL (ref 0–99)
Triglycerides: 104 mg/dL (ref 0–149)
VLDL Cholesterol Cal: 18 mg/dL (ref 5–40)

## 2023-01-06 MED ORDER — HYDROCHLOROTHIAZIDE 12.5 MG PO CAPS: 12.5000 mg | ORAL_CAPSULE | Freq: Every day | ORAL | 3 refills | Status: DC

## 2023-01-06 MED ORDER — POTASSIUM CHLORIDE ER 10 MEQ PO TBCR: 10.0000 meq | EXTENDED_RELEASE_TABLET | Freq: Every day | ORAL | 3 refills | Status: DC

## 2023-01-06 MED ORDER — NEBIVOLOL HCL 5 MG PO TABS: 5.0000 mg | ORAL_TABLET | Freq: Every day | ORAL | 3 refills | Status: DC

## 2023-01-06 MED ORDER — VALSARTAN 160 MG PO TABS: 160.0000 mg | ORAL_TABLET | Freq: Every day | ORAL | 3 refills | Status: DC

## 2023-01-06 NOTE — Patient Instructions (Signed)
Medication Instructions:  START Hydrochlorothiazide 12.5mg  daily START Potassium Chloride daily REFILLED Nebivolol and Valsartan *If you need a refill on your cardiac medications before your next appointment, please call your pharmacy*  Lab Work: Lipids, ALT today BMET in 3 weeks If you have labs (blood work) drawn today and your tests are completely normal, you will receive your results only by: MyChart Message (if you have MyChart) OR A paper copy in the mail If you have any lab test that is abnormal or we need to change your treatment, we will call you to review the results.  Testing/Procedures: NONE  Follow-Up: At [facility], you and your health needs are our priority.  As part of our continuing mission to provide you with exceptional heart care, we have created designated Provider Care Teams.  These Care Teams include your primary Cardiologist (physician) and Advanced Practice Providers (APPs -  Physician Assistants and Nurse Practitioners) who all work together to provide you with the care you need, when you need it.   Your next appointment:   3 month(s)  Provider:   Kristeen Miss, MD

## 2023-01-27 ENCOUNTER — Ambulatory Visit: Payer: BC Managed Care – PPO | Attending: Cardiovascular Disease

## 2023-01-27 DIAGNOSIS — I10 Essential (primary) hypertension: Secondary | ICD-10-CM | POA: Diagnosis not present

## 2023-01-27 DIAGNOSIS — Z79899 Other long term (current) drug therapy: Secondary | ICD-10-CM | POA: Diagnosis not present

## 2023-01-27 DIAGNOSIS — E782 Mixed hyperlipidemia: Secondary | ICD-10-CM

## 2023-01-27 DIAGNOSIS — I2510 Atherosclerotic heart disease of native coronary artery without angina pectoris: Secondary | ICD-10-CM

## 2023-01-28 LAB — BASIC METABOLIC PANEL
BUN/Creatinine Ratio: 15 (ref 12–28)
BUN: 14 mg/dL (ref 8–27)
CO2: 27 mmol/L (ref 20–29)
Calcium: 9.7 mg/dL (ref 8.7–10.3)
Chloride: 100 mmol/L (ref 96–106)
Creatinine, Ser: 0.93 mg/dL (ref 0.57–1.00)
Glucose: 97 mg/dL (ref 70–99)
Potassium: 4.4 mmol/L (ref 3.5–5.2)
Sodium: 143 mmol/L (ref 134–144)
eGFR: 69 mL/min/{1.73_m2} (ref >59)

## 2023-02-09 IMAGING — MR MR ABDOMEN WO/W CM
9 of 15 series · 28 of 48 positions shown · IV contrast (14ml Multihance)
Comparison: Abdominal ultrasound, 10/24/2020, CT abdomen pelvis,
08/25/2016

CLINICAL DATA: Erratic right upper quadrant abdominal pain, common
bile duct dilatation identified by prior ultrasound

EXAM:
MRI ABDOMEN WITHOUT AND WITH CONTRAST
TECHNIQUE: Multiplanar multisequence MR imaging of the abdomen was performed
both before and after the administration of intravenous contrast.
CONTRAST:  14mL MULTIHANCE GADOBENATE DIMEGLUMINE 529 MG/ML IV SOLN

[Series 5: T2 · coronal · 5.0mm · 1.41mm/px · 2 of 31 slices shown (1 of 4)]
[im 1/31]
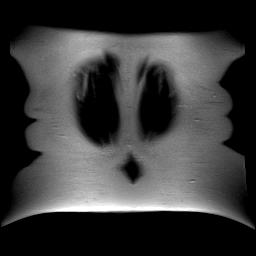
[im 31/31]
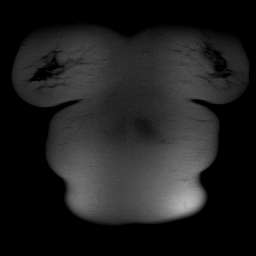

[Series 6: T2 · axial · 6.0mm · 1.48mm/px · z∈[-120,+100]mm · 2 of 33 slices shown (2 of 4)]
[im 1/33]
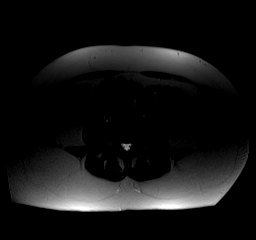
[im 33/33]
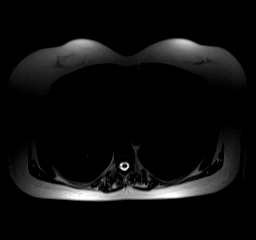

[Series 9: T2 · axial · 6.0mm · 0.74mm/px · z∈[-123,+118]mm · 2 of 36 slices shown (3 of 4)]
[im 1/36]
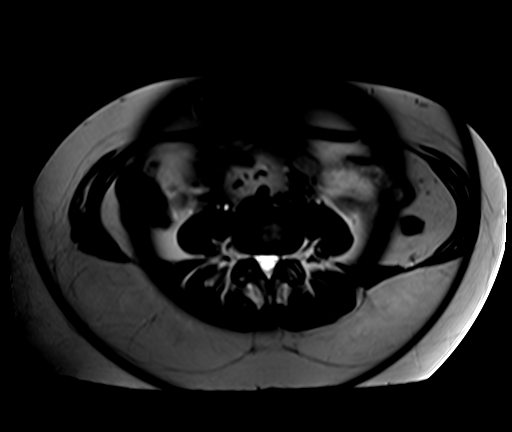
[im 36/36]
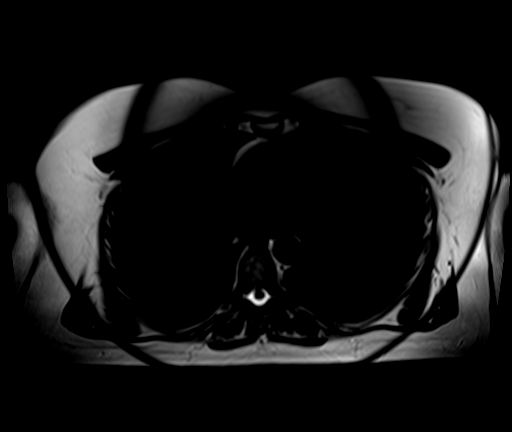

[Series 16: T2 · axial · 5.0mm · 1.48mm/px · z∈[-141,+99]mm · 2 of 38 slices shown (4 of 4)]
[im 1/38]
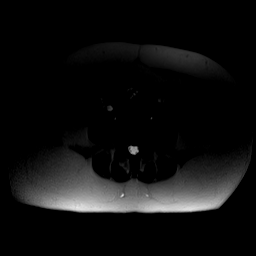
[im 38/38]
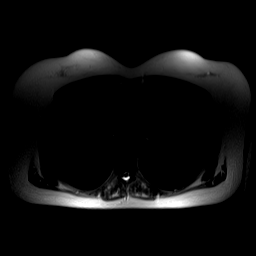

[Series 17: axial in out · axial · 5.0mm · 0.74mm/px · z∈[-141,+99]mm · 4 of 76 slices shown]
[im 1/76]
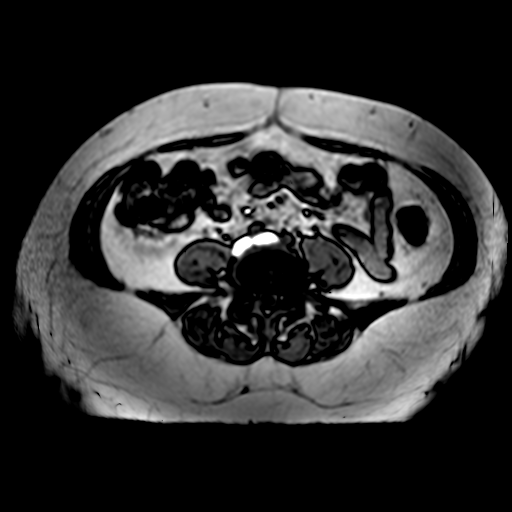
[im 26/76]
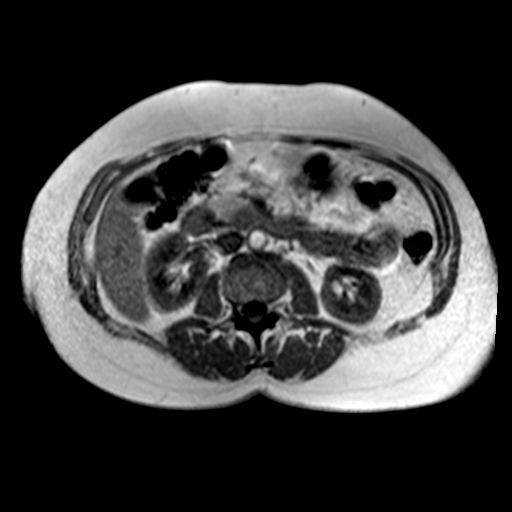
[im 51/76]
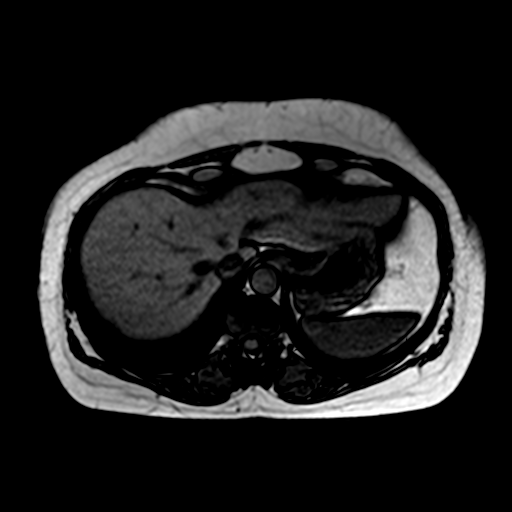
[im 76/76]
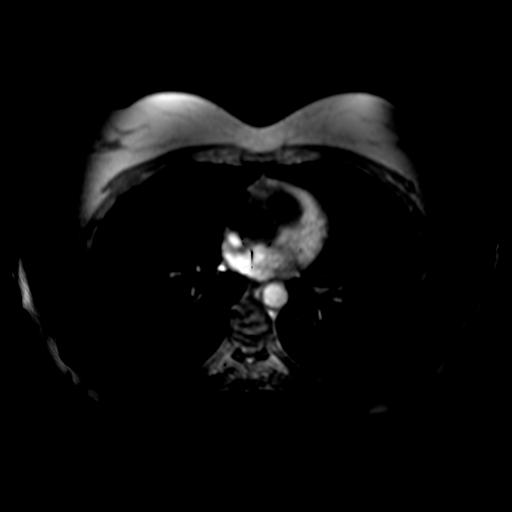

[Series 18: [id number] · axial · 6.0mm · 1.98mm/px · z∈[-101,+133]mm · 5 of 93 slices shown]
[im 1/93]
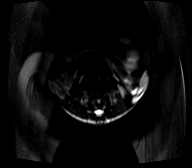
[im 24/93]
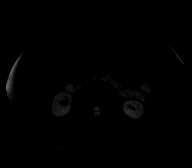
[im 47/93]
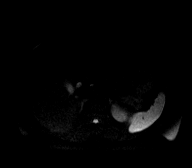
[im 70/93]
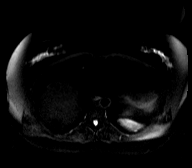
[im 93/93]
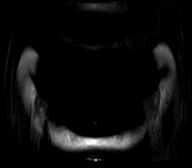

[Series 20: T1 dynamic · axial · non-contrast · 2.5mm · 0.74mm/px · z∈[-126,+92]mm · 4 of 88 slices shown]
[im 1/88]
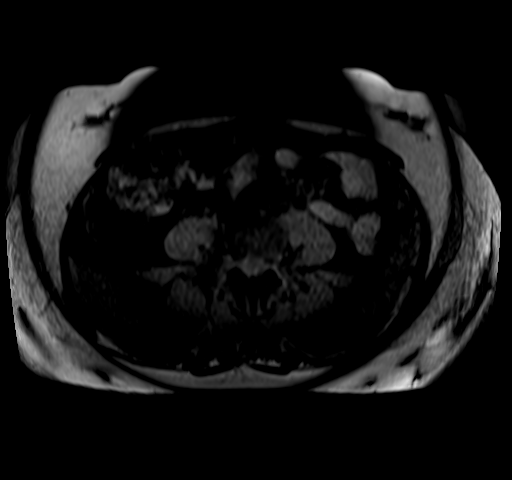
[im 30/88]
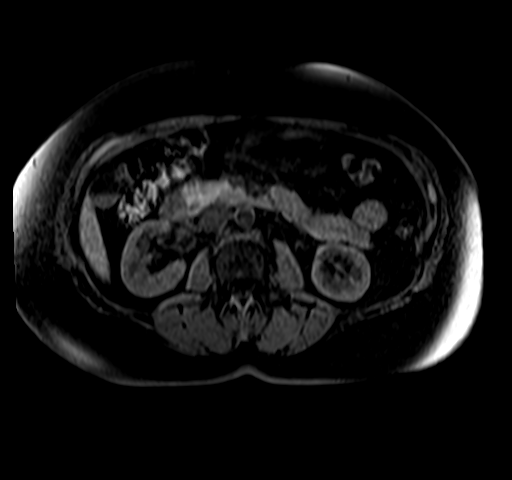
[im 59/88]
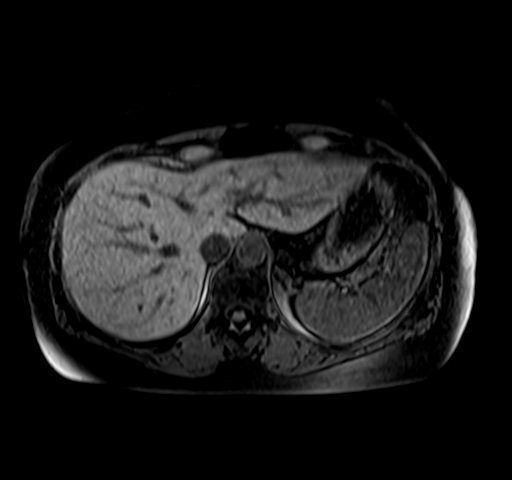
[im 88/88]
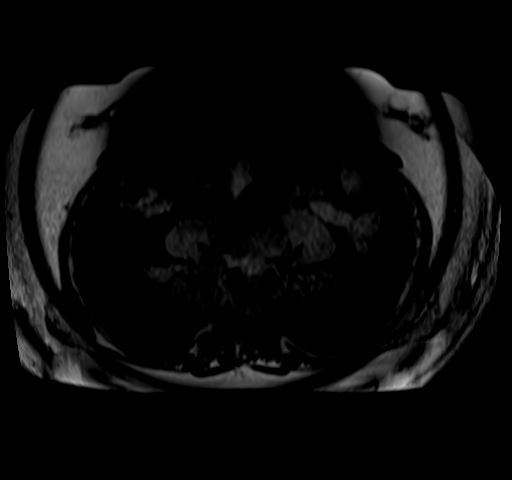

[Series 22: post 25 sec_sub · axial · 2.5mm · 0.74mm/px · z∈[-126,+92]mm · 4 of 88 slices shown]
[im 1/88]
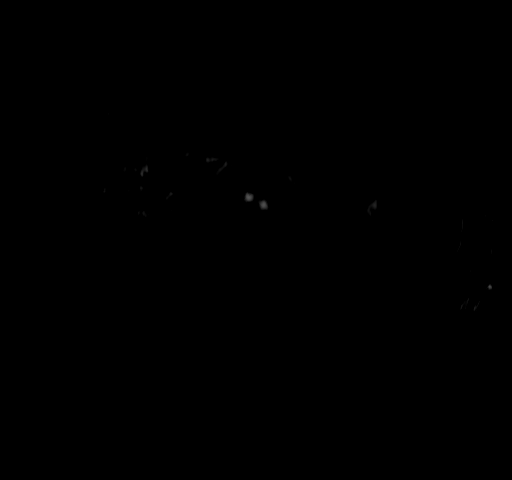
[im 30/88]
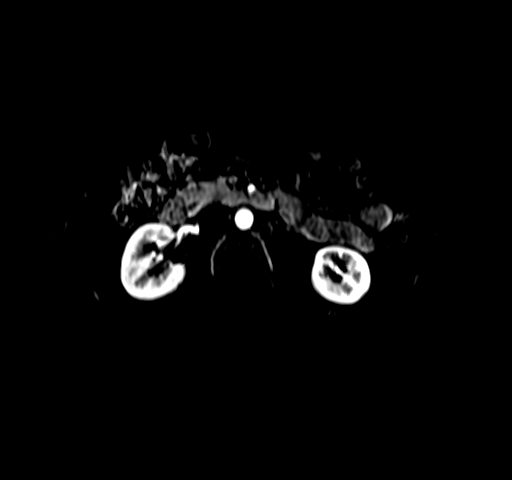
[im 59/88]
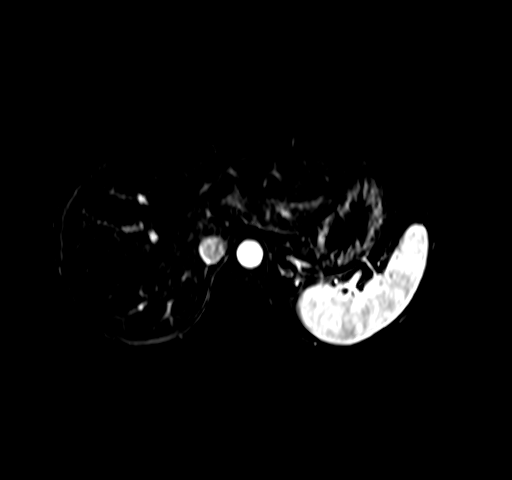
[im 88/88]
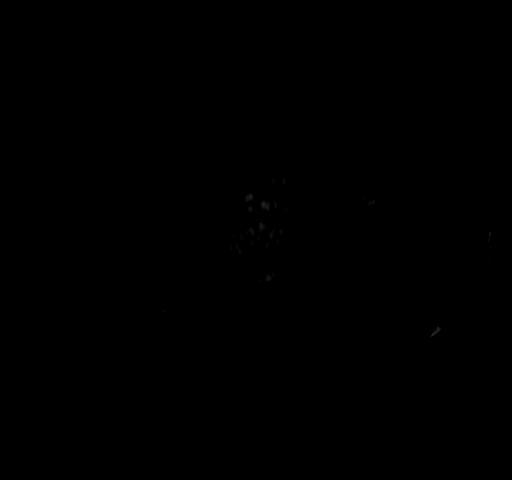

[Series 24: post 45 sec_sub · axial · 2.5mm · 0.74mm/px · z∈[-126,+19]mm · 3 of 88 slices shown]
[im 1/88]
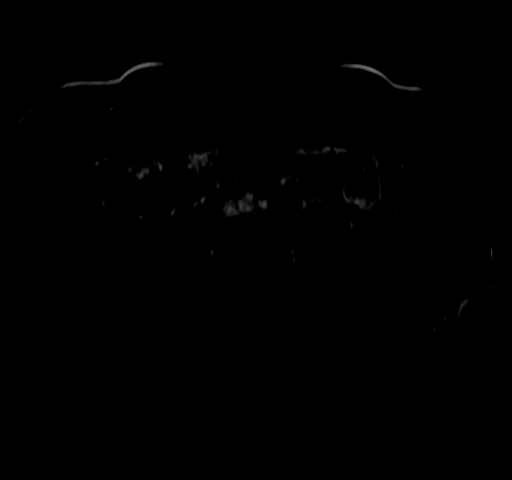
[im 30/88]
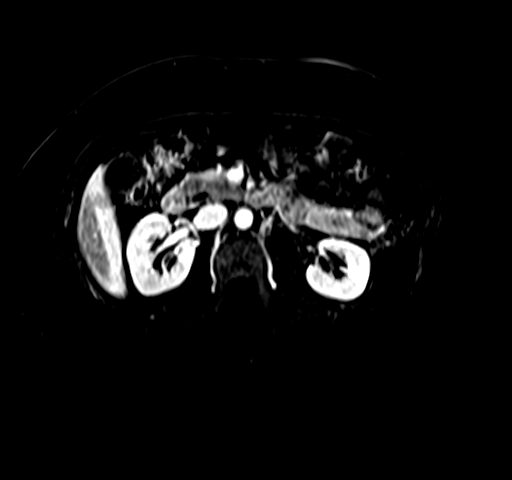
[im 59/88]
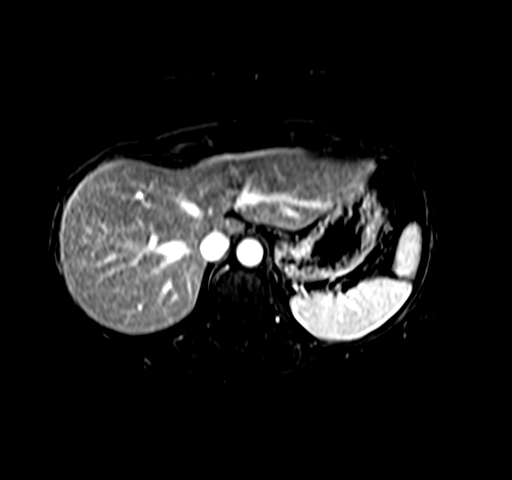

[28 of 48 positions shown; findings below may reference images not displayed]

FINDINGS: Lower chest: No acute findings.

Hepatobiliary: No mass or other parenchymal abnormality identified.
Mild hepatic steatosis. Redemonstrated diffuse dilatation of the
common bile duct up to 1.0 cm, which tapers smoothly to the ampulla
without obstructing calculus or mass identified (series 5, image
20). No significant intrahepatic biliary ductal dilatation.

Pancreas: No mass, inflammatory changes, or other parenchymal
abnormality identified. No pancreatic ductal dilatation.

Spleen:  Within normal limits in size and appearance.

Adrenals/Urinary Tract: No masses identified. No evidence of
hydronephrosis.

Stomach/Bowel: Visualized portions within the abdomen are
unremarkable.

Vascular/Lymphatic: No pathologically enlarged lymph nodes
identified. No abdominal aortic aneurysm demonstrated.

Other:  None.

Musculoskeletal: No suspicious bone lesions identified.
IMPRESSION: 1. Redemonstrated diffuse dilatation of the common bile duct up to
1.0 cm, which tapers smoothly to the ampulla without obstructing
calculus or mass identified. This is of uncertain significance.
Consider further evaluation via ERCP/EUS if there is biochemical
evidence of biliary ductal obstruction.
2. Mild hepatic steatosis.

## 2023-02-18 DIAGNOSIS — M9903 Segmental and somatic dysfunction of lumbar region: Secondary | ICD-10-CM | POA: Diagnosis not present

## 2023-02-18 DIAGNOSIS — G44209 Tension-type headache, unspecified, not intractable: Secondary | ICD-10-CM | POA: Diagnosis not present

## 2023-02-18 DIAGNOSIS — M9901 Segmental and somatic dysfunction of cervical region: Secondary | ICD-10-CM | POA: Diagnosis not present

## 2023-02-18 DIAGNOSIS — M9902 Segmental and somatic dysfunction of thoracic region: Secondary | ICD-10-CM | POA: Diagnosis not present

## 2023-03-08 ENCOUNTER — Telehealth: Payer: Self-pay | Admitting: Cardiovascular Disease

## 2023-03-08 NOTE — Telephone Encounter (Signed)
Received call transferred from operator and spoke with patient.  She reports right shoulder/back pain which started about a month ago.  Had been moving and lifting boxes.  Saw chiropractor. Area is still a little tender.  Feels better when massaged. Right shoulder pain happens both at rest and with exertion. Last for several hours. Over the weekend she started having numbness and tingling in her jaw.  No pain. Tingling will start in left jaw and move to right side of chin.  No recent dental issues.  No numbness in extremities but does have tingling between shoulder blades at times.  Was very congested over the weekend.  Took Coricidin.  Congestion is better this AM. Patient is asking if symptoms could be heart related.  Will forward to Dr Elease Hashimoto for review/recommendations.

## 2023-03-08 NOTE — Telephone Encounter (Signed)
Pt states she is having more so shoulder pain. She states she also experiences some chest pain but when she pushes/ presses on it, it feels better. She states she is experiencing some numbness and tingling in her jaw currently.

## 2023-03-09 DIAGNOSIS — M9903 Segmental and somatic dysfunction of lumbar region: Secondary | ICD-10-CM | POA: Diagnosis not present

## 2023-03-09 DIAGNOSIS — M9901 Segmental and somatic dysfunction of cervical region: Secondary | ICD-10-CM | POA: Diagnosis not present

## 2023-03-09 DIAGNOSIS — M9902 Segmental and somatic dysfunction of thoracic region: Secondary | ICD-10-CM | POA: Diagnosis not present

## 2023-03-09 DIAGNOSIS — G44209 Tension-type headache, unspecified, not intractable: Secondary | ICD-10-CM | POA: Diagnosis not present

## 2023-03-09 NOTE — Telephone Encounter (Signed)
Nahser, Christy Ping, MD  You19 hours ago (2:20 PM)   This does not sound cardiac . Her pain should improved with tylenol and ibuprofen. She should consult with her primary MD if the pain persists  PN   Left detailed message of above MD's comments per DPR.

## 2023-04-12 ENCOUNTER — Encounter: Payer: Self-pay | Admitting: Cardiovascular Disease

## 2023-04-12 NOTE — Progress Notes (Unsigned)
Cardiology Office Note:    Date:  04/12/2023   ID:  Christy Powers, DOB [birth year], MRN [id number]  PCP:  Deatra James, MD  [facility] Cardiologist:  [facility] Electrophysiologist:  None   Referring MD: Deatra James, MD   Chief Complaint  Patient presents with   Hypertension         Previous notes:    Charon Burgen is a 62 y.o. female with a hx of HTN.   She has a strong family hx of CAD.   We were asked to see her today by Dr. Nicholos Johns for further evaluation of her CV risk.  HTN for years , has been well controlled.   Both mother and father had premature heart disease / stroke . Has a hx of HLD - is on pravachol  Walks pm a regular basis - 1-2 miles a day .  No CP or dyspnea.   No syncope or presyncope   Non smoker  No complaints of any sort.   Just here to assess risk and get an understanding of how she can improve   Sept. 6, 2022 Kamariana is here to follow up with her HTN and HLD.   Has a strong family hx of premature CAD Coronary calcium score of 24. This was 32 percentile for age and sex matched control. In Dec. 2021 LDL particle number was 663.   LDL = 76. Walks regularly   Sept 7, 2023   Walks regularly  Father died of MI at age 51  Mother had strokes   Tylena is seen for new HTN Noted her BP to be fast   Woke up today with her heart racing  178/110  Is having some jaw tingling and tingling along the inside of her left arm   Is not eating much salt but did eat pizza last night  The pizza did not sound very salty   Has been on Meloxicam for her   foot surgery  She has been on meloxicam for 6 weeks.  She really does not check her blood pressure regularly but did decide to check it today because she felt poorly ( heart racing )   Has been on Lidocaine patches  Takes CBD gummies for sleep    Lots of indigestion today  Wt is 165 ( down 2 lbs ) Had foot surgery several months  Had 1 diet coke this am She has a strong family history of coronary  artery disease.   LP(a) on January 08, 2022 is 8.6.  Will check BMP  She had her Hb checked at gyn and she is not anemic Troponin   July 10, 2022 Symiah is seen for follow up of her chest pain , HTN She had markedly elevated last week.   She was on moloxicam  We stopped the meloxicam and gave her Voltaren gel.  We started Bystolic 5 mg a day, HCTZ 25 mg a day, potassium chloride once a day.  She now returns with a very normal/low blood pressure.  She is feeling quite a bit better.  We will be discontinuing the HCTZ and potassium for now.  She will keep these on hand.  We may need to restart them back on an alternate day schedule or perhaps half dose at some point if her blood pressure increases.  She will continue to use the Voltaren gel and hold off on using meloxicam as much as she can.  We discussed a coronary calcium score from September, 2021.  Her coronary calcium score was 24 which places her in the 80th percentile for age and sex matched controls  LP(a)  is 8.4 ( normal)  Last lipid levels from Sept. 2023 LDL is 53 Chol = 158 HDL = 93 Trigs = 57  August 13, 2022 Jerney is seen for follow up of her CP, HTN, HLD  She was seen here a month or so ago with significant hypertension. We have started Bystolic and HCTZ along with her Valsartan  We have discontinued her Voltaren and started her on diclofenac gel. We have DC'd the HCTZ and kdur  BP remains well controlled.    Sept. 4, 2024 Miquel is seen for follow up of her CP, HTN, HLD BP has been better controlled at home  [facility] regularly    Diastolic BP is mildly elevated  She wants to lose some weight   Dec. 10, 2024 Mahalia is seen for follow up of her CP, HTN, HLD     Past Medical History:  Diagnosis Date   Anxiety    Family history of cardiovascular disease     History reviewed. No pertinent surgical history.  Current Medications: No outpatient medications have been marked as taking for the 04/13/23 encounter  (Office Visit) with Bernon Arviso, Deloris Ping, MD.     Allergies:   Patient has no known allergies.   Social History   Socioeconomic History   Marital status: Married    Spouse name: Not on file   Number of children: Not on file   Years of education: Not on file   Highest education level: Not on file  Occupational History   Not on file  Tobacco Use   Smoking status: Never   Smokeless tobacco: Never  Substance and Sexual Activity   Alcohol use: Not on file   Drug use: Not on file   Sexual activity: Yes  Other Topics Concern   Not on file  Social History Narrative   Not on file   Social Determinants of Health   Financial Resource Strain: Not on file  Food Insecurity: Not on file  Transportation Needs: Not on file  Physical Activity: Not on file  Stress: Not on file  Social Connections: Not on file     Family History: The patient's family history includes CVA in her mother; Heart attack in her father; Heart disease (age of onset: 63) in her father; Hypercholesterolemia in her mother; Hypertension in her mother.  ROS:   Please see the history of present illness.     All other systems reviewed and are negative.  EKGs/Labs/Other Studies Reviewed:    The following studies were reviewed today:   EKG:      Recent Labs: 01/06/2023: ALT 20 01/27/2023: BUN 14; Creatinine, Ser 0.93; Potassium 4.4; Sodium 143  Recent Lipid Panel    Component Value Date/Time   CHOL 173 01/06/2023 0846   TRIG 104 01/06/2023 0846   HDL 82 01/06/2023 0846   CHOLHDL 2.1 01/06/2023 0846   LDLCALC 73 01/06/2023 0846    Physical Exam:      Physical Exam: There were no vitals taken for this visit.  No BP recorded.  {Refresh Note OR Click here to enter BP  :1}***    GEN:  Well nourished, well developed in no acute distress HEENT: Normal NECK: No JVD; No carotid bruits LYMPHATICS: No lymphadenopathy CARDIAC: RRR ***, no murmurs, rubs, gallops RESPIRATORY:  Clear to auscultation without  rales, wheezing or rhonchi  ABDOMEN: Soft, non-tender, non-distended MUSCULOSKELETAL:  No edema; No deformity  SKIN: Warm and dry NEUROLOGIC:  Alert and oriented x 3     ASSESSMENT:    No diagnosis found.        PLAN:      Hyperlipidemia:       2.  Essential Hypertension:        3.  Coronary artery calcification:      .  3.  Family history of CAD and stroke   4.  Hyperglycemia:         Medication Adjustments/Labs and Tests Ordered: Current medicines are reviewed at length with the patient today.  Concerns regarding medicines are outlined above.  No orders of the defined types were placed in this encounter.   No orders of the defined types were placed in this encounter.   I will see her again in 3 months   There are no Patient Instructions on file for this visit.   Signed, Kristeen Miss, MD  04/12/2023 9:33 PM    [facility]

## 2023-04-13 ENCOUNTER — Ambulatory Visit: Payer: BC Managed Care – PPO | Attending: Cardiovascular Disease | Admitting: Cardiovascular Disease

## 2023-04-13 ENCOUNTER — Encounter: Payer: Self-pay | Admitting: Cardiovascular Disease

## 2023-04-13 VITALS — BP 118/62 | HR 72 | Ht 64.5 in | Wt 171.4 lb

## 2023-04-13 DIAGNOSIS — I10 Essential (primary) hypertension: Secondary | ICD-10-CM | POA: Diagnosis not present

## 2023-04-13 NOTE — Patient Instructions (Signed)
Follow-Up: At [facility], you and your health needs are our priority.  As part of our continuing mission to provide you with exceptional heart care, we have created designated Provider Care Teams.  These Care Teams include your primary Cardiologist (physician) and Advanced Practice Providers (APPs -  Physician Assistants and Nurse Practitioners) who all work together to provide you with the care you need, when you need it.  Your next appointment:   1 year(s)  Provider:   Chilton Si, MD

## 2023-07-02 DIAGNOSIS — L219 Seborrheic dermatitis, unspecified: Secondary | ICD-10-CM | POA: Diagnosis not present

## 2023-07-15 ENCOUNTER — Ambulatory Visit (INDEPENDENT_AMBULATORY_CARE_PROVIDER_SITE_OTHER): Payer: BC Managed Care – PPO | Admitting: Podiatry

## 2023-07-15 ENCOUNTER — Encounter: Payer: Self-pay | Admitting: Podiatry

## 2023-07-15 ENCOUNTER — Ambulatory Visit (INDEPENDENT_AMBULATORY_CARE_PROVIDER_SITE_OTHER)

## 2023-07-15 DIAGNOSIS — M2041 Other hammer toe(s) (acquired), right foot: Secondary | ICD-10-CM

## 2023-07-15 DIAGNOSIS — M7751 Other enthesopathy of right foot: Secondary | ICD-10-CM | POA: Diagnosis not present

## 2023-07-15 MED ORDER — TRIAMCINOLONE ACETONIDE 10 MG/ML IJ SUSP
10.0000 mg | Freq: Once | INTRAMUSCULAR | Status: AC
  Administered 2023-07-15: 10 mg via INTRA_ARTICULAR

## 2023-07-16 DIAGNOSIS — Z01419 Encounter for gynecological examination (general) (routine) without abnormal findings: Secondary | ICD-10-CM | POA: Diagnosis not present

## 2023-07-16 DIAGNOSIS — Z1231 Encounter for screening mammogram for malignant neoplasm of breast: Secondary | ICD-10-CM | POA: Diagnosis not present

## 2023-07-16 DIAGNOSIS — Z6828 Body mass index (BMI) 28.0-28.9, adult: Secondary | ICD-10-CM | POA: Diagnosis not present

## 2023-07-16 NOTE — Progress Notes (Signed)
 Subjective:   Patient ID: Christy Powers, female   DOB: 63 y.o.   MRN: [id number]   HPI Patient states that she still is having some pain in the right foot after surgery a little over a year ago.  States that she is walking much better and she is for the most part but in shoes barefoot bothers her and she is not sure if the other toes touched   ROS      Objective:  Physical Exam  Neurovascular status intact with the patient found to have excellent healing of surgical site right second toe second metatarsal.  The toe is slightly stiff and there may be some scar tissue around the MPJ but there is no plantar discomfort slight prominence of the third metatarsal but not discomforting currently     Assessment:  Overall doing well may have some inflammatory capsulitis with some scar tissue around the second MPJ but overall positioning excellent     Plan:  H&P x-ray taken reviewed and I went ahead did sterile prep and injected periarticular around the joint 3 mg dexamethasone 5 mg Xylocaine Kenalog and advised on good supportive shoes.  If symptoms were to continue or new symptoms were to come patient's to be seen back  X-rays indicate that everything has healed well there is slight shortening of the second metatarsal in comparison to third metatarsal but that is not currently symptomatic and I did note fixation in place

## 2023-08-26 ENCOUNTER — Other Ambulatory Visit: Payer: Self-pay | Admitting: Cardiovascular Disease

## 2023-08-26 DIAGNOSIS — I10 Essential (primary) hypertension: Secondary | ICD-10-CM

## 2023-08-26 DIAGNOSIS — Z79899 Other long term (current) drug therapy: Secondary | ICD-10-CM

## 2023-08-26 DIAGNOSIS — E782 Mixed hyperlipidemia: Secondary | ICD-10-CM

## 2023-08-26 DIAGNOSIS — I2510 Atherosclerotic heart disease of native coronary artery without angina pectoris: Secondary | ICD-10-CM

## 2023-09-08 DIAGNOSIS — M9903 Segmental and somatic dysfunction of lumbar region: Secondary | ICD-10-CM | POA: Diagnosis not present

## 2023-09-08 DIAGNOSIS — M9901 Segmental and somatic dysfunction of cervical region: Secondary | ICD-10-CM | POA: Diagnosis not present

## 2023-09-08 DIAGNOSIS — M9902 Segmental and somatic dysfunction of thoracic region: Secondary | ICD-10-CM | POA: Diagnosis not present

## 2023-09-08 DIAGNOSIS — G44209 Tension-type headache, unspecified, not intractable: Secondary | ICD-10-CM | POA: Diagnosis not present

## 2023-10-24 ENCOUNTER — Other Ambulatory Visit: Payer: Self-pay | Admitting: Cardiovascular Disease

## 2023-10-24 DIAGNOSIS — E782 Mixed hyperlipidemia: Secondary | ICD-10-CM

## 2023-10-24 DIAGNOSIS — Z79899 Other long term (current) drug therapy: Secondary | ICD-10-CM

## 2023-10-24 DIAGNOSIS — I2510 Atherosclerotic heart disease of native coronary artery without angina pectoris: Secondary | ICD-10-CM

## 2023-10-24 DIAGNOSIS — I10 Essential (primary) hypertension: Secondary | ICD-10-CM

## 2023-12-24 ENCOUNTER — Other Ambulatory Visit: Payer: Self-pay | Admitting: Physician Assistant

## 2023-12-24 DIAGNOSIS — Z79899 Other long term (current) drug therapy: Secondary | ICD-10-CM

## 2023-12-24 DIAGNOSIS — I10 Essential (primary) hypertension: Secondary | ICD-10-CM

## 2023-12-24 DIAGNOSIS — I2510 Atherosclerotic heart disease of native coronary artery without angina pectoris: Secondary | ICD-10-CM

## 2023-12-24 DIAGNOSIS — E782 Mixed hyperlipidemia: Secondary | ICD-10-CM

## 2023-12-27 DIAGNOSIS — K219 Gastro-esophageal reflux disease without esophagitis: Secondary | ICD-10-CM | POA: Diagnosis not present

## 2023-12-27 DIAGNOSIS — R202 Paresthesia of skin: Secondary | ICD-10-CM | POA: Diagnosis not present

## 2023-12-27 DIAGNOSIS — R0789 Other chest pain: Secondary | ICD-10-CM | POA: Diagnosis not present

## 2023-12-27 MED ORDER — VALSARTAN 160 MG PO TABS: 160.0000 mg | ORAL_TABLET | Freq: Every day | ORAL | 0 refills | Status: DC

## 2024-01-06 ENCOUNTER — Other Ambulatory Visit: Payer: Self-pay | Admitting: Physician Assistant

## 2024-01-06 DIAGNOSIS — Z79899 Other long term (current) drug therapy: Secondary | ICD-10-CM

## 2024-01-06 DIAGNOSIS — I2510 Atherosclerotic heart disease of native coronary artery without angina pectoris: Secondary | ICD-10-CM

## 2024-01-06 DIAGNOSIS — I10 Essential (primary) hypertension: Secondary | ICD-10-CM

## 2024-01-06 DIAGNOSIS — E782 Mixed hyperlipidemia: Secondary | ICD-10-CM

## 2024-01-06 MED ORDER — NEBIVOLOL HCL 5 MG PO TABS: 5.0000 mg | ORAL_TABLET | Freq: Every day | ORAL | 0 refills | Status: AC

## 2024-01-11 ENCOUNTER — Telehealth: Payer: Self-pay | Admitting: Cardiovascular Disease

## 2024-01-11 DIAGNOSIS — E782 Mixed hyperlipidemia: Secondary | ICD-10-CM

## 2024-01-11 DIAGNOSIS — Z79899 Other long term (current) drug therapy: Secondary | ICD-10-CM

## 2024-01-11 DIAGNOSIS — I10 Essential (primary) hypertension: Secondary | ICD-10-CM

## 2024-01-11 DIAGNOSIS — I2510 Atherosclerotic heart disease of native coronary artery without angina pectoris: Secondary | ICD-10-CM

## 2024-01-11 MED ORDER — HYDROCHLOROTHIAZIDE 12.5 MG PO CAPS: 12.5000 mg | ORAL_CAPSULE | Freq: Every day | ORAL | 3 refills | Status: AC

## 2024-01-11 MED ORDER — ROSUVASTATIN CALCIUM 20 MG PO TABS: 20.0000 mg | ORAL_TABLET | Freq: Every day | ORAL | 0 refills | Status: DC

## 2024-01-11 NOTE — Telephone Encounter (Signed)
 RX sent in

## 2024-01-11 NOTE — Telephone Encounter (Signed)
*  STAT* If patient is at the pharmacy, call can be transferred to refill team.   1. Which medications need to be refilled? (please list name of each medication and dose if known)  rosuvastatin  (CRESTOR ) 20 MG tablet  hydrochlorothiazide  (MICROZIDE ) 12.5 MG capsule   2. Would you like to learn more about the convenience, safety, & potential cost savings by using the [facility]?   3. Are you open to using the [facility] (Type [facility].  ).  4. Which pharmacy/location (including street and city if local pharmacy) is medication to be sent to?  [id number] - [place], [place] - [address]    5. Do they need a 30 day or 90 day supply? 90 day

## 2024-01-19 DIAGNOSIS — M9903 Segmental and somatic dysfunction of lumbar region: Secondary | ICD-10-CM | POA: Diagnosis not present

## 2024-01-19 DIAGNOSIS — M9901 Segmental and somatic dysfunction of cervical region: Secondary | ICD-10-CM | POA: Diagnosis not present

## 2024-01-19 DIAGNOSIS — M9902 Segmental and somatic dysfunction of thoracic region: Secondary | ICD-10-CM | POA: Diagnosis not present

## 2024-01-19 DIAGNOSIS — G44209 Tension-type headache, unspecified, not intractable: Secondary | ICD-10-CM | POA: Diagnosis not present

## 2024-03-09 DIAGNOSIS — L820 Inflamed seborrheic keratosis: Secondary | ICD-10-CM | POA: Diagnosis not present

## 2024-03-09 DIAGNOSIS — B308 Other viral conjunctivitis: Secondary | ICD-10-CM | POA: Diagnosis not present

## 2024-03-09 DIAGNOSIS — D485 Neoplasm of uncertain behavior of skin: Secondary | ICD-10-CM | POA: Diagnosis not present

## 2024-03-15 ENCOUNTER — Telehealth: Payer: Self-pay | Admitting: Cardiovascular Disease

## 2024-03-15 DIAGNOSIS — E782 Mixed hyperlipidemia: Secondary | ICD-10-CM

## 2024-03-15 DIAGNOSIS — I10 Essential (primary) hypertension: Secondary | ICD-10-CM

## 2024-03-15 DIAGNOSIS — Z79899 Other long term (current) drug therapy: Secondary | ICD-10-CM

## 2024-03-15 DIAGNOSIS — I2510 Atherosclerotic heart disease of native coronary artery without angina pectoris: Secondary | ICD-10-CM

## 2024-03-15 MED ORDER — POTASSIUM CHLORIDE ER 10 MEQ PO TBCR: 10.0000 meq | EXTENDED_RELEASE_TABLET | Freq: Every day | ORAL | 0 refills | Status: DC

## 2024-03-15 NOTE — Telephone Encounter (Signed)
 Pt c/o medication issue:  1. Name of Medication: potassium chloride  (KLOR-CON ) 10 MEQ tablet (Expired)   2. How are you currently taking this medication (dosage and times per day)?    3. Are you having a reaction (difficulty breathing--STAT)? no  4. What is your medication issue? Patient is calling in for a refill for this prescription, but it marked as that the patient is no longer taking medication. Patient is do have an appt on 12/8. Please advise

## 2024-03-15 NOTE — Telephone Encounter (Signed)
 RN called patient . Patient states she has been taking Potassium  since last ov in 04/2023.  A month prescription has been sent to local pharmacy.   At upcoming appt on 04/10/24 - she will discuss with Dr Lonni to continue with a 90 day supply.

## 2024-03-20 ENCOUNTER — Other Ambulatory Visit: Payer: Self-pay | Admitting: Physician Assistant

## 2024-03-20 DIAGNOSIS — I10 Essential (primary) hypertension: Secondary | ICD-10-CM

## 2024-03-20 DIAGNOSIS — E782 Mixed hyperlipidemia: Secondary | ICD-10-CM

## 2024-03-20 DIAGNOSIS — I2510 Atherosclerotic heart disease of native coronary artery without angina pectoris: Secondary | ICD-10-CM

## 2024-03-20 DIAGNOSIS — Z79899 Other long term (current) drug therapy: Secondary | ICD-10-CM

## 2024-04-05 DIAGNOSIS — M9902 Segmental and somatic dysfunction of thoracic region: Secondary | ICD-10-CM | POA: Diagnosis not present

## 2024-04-05 DIAGNOSIS — M9903 Segmental and somatic dysfunction of lumbar region: Secondary | ICD-10-CM | POA: Diagnosis not present

## 2024-04-05 DIAGNOSIS — M9901 Segmental and somatic dysfunction of cervical region: Secondary | ICD-10-CM | POA: Diagnosis not present

## 2024-04-05 DIAGNOSIS — G44209 Tension-type headache, unspecified, not intractable: Secondary | ICD-10-CM | POA: Diagnosis not present

## 2024-04-07 ENCOUNTER — Other Ambulatory Visit: Payer: Self-pay | Admitting: Cardiology

## 2024-04-07 DIAGNOSIS — I10 Essential (primary) hypertension: Secondary | ICD-10-CM

## 2024-04-07 DIAGNOSIS — Z79899 Other long term (current) drug therapy: Secondary | ICD-10-CM

## 2024-04-07 DIAGNOSIS — I2510 Atherosclerotic heart disease of native coronary artery without angina pectoris: Secondary | ICD-10-CM

## 2024-04-07 DIAGNOSIS — E782 Mixed hyperlipidemia: Secondary | ICD-10-CM

## 2024-04-10 ENCOUNTER — Encounter (HOSPITAL_BASED_OUTPATIENT_CLINIC_OR_DEPARTMENT_OTHER): Payer: Self-pay | Admitting: Cardiology

## 2024-04-10 ENCOUNTER — Ambulatory Visit (INDEPENDENT_AMBULATORY_CARE_PROVIDER_SITE_OTHER): Admitting: Cardiology

## 2024-04-10 VITALS — BP 120/78 | HR 75 | Ht 64.5 in | Wt 180.3 lb

## 2024-04-10 DIAGNOSIS — I10 Essential (primary) hypertension: Secondary | ICD-10-CM

## 2024-04-10 DIAGNOSIS — E7800 Pure hypercholesterolemia, unspecified: Secondary | ICD-10-CM

## 2024-04-10 DIAGNOSIS — I2510 Atherosclerotic heart disease of native coronary artery without angina pectoris: Secondary | ICD-10-CM | POA: Diagnosis not present

## 2024-04-10 DIAGNOSIS — Z7189 Other specified counseling: Secondary | ICD-10-CM

## 2024-04-10 MED ORDER — ASPIRIN 81 MG PO TBEC: 81.0000 mg | DELAYED_RELEASE_TABLET | Freq: Every day | ORAL | Status: AC

## 2024-04-10 NOTE — Patient Instructions (Addendum)
 Medication Instructions:  Your physician has recommended you make the following change in your medication:  1.) start aspirin  81 mg - one tablet daily  *If you need a refill on your cardiac medications before your next appointment, please call your pharmacy*  Lab Work: Today: lipid panel/bmet  Testing/Procedures: none  Follow-Up: At [facility], you and your health needs are our priority.  As part of our continuing mission to provide you with exceptional heart care, our providers are all part of one team.  This team includes your primary Cardiologist (physician) and Advanced Practice Providers or APPs (Physician Assistants and Nurse Practitioners) who all work together to provide you with the care you need, when you need it.  Your next appointment:   12 month(s)  Provider:   Shelda Bruckner, MD, Rosaline Bane, NP, or Reche Finder, NP

## 2024-04-10 NOTE — Progress Notes (Signed)
 Cardiology Office Note:  .   Date:  04/10/2024  ID:  Christy Powers, DOB [birth year], MRN [id number] PCP: Sun, Vyvyan, MD  [facility] Providers Cardiologist:  Christy Bruckner, MD {  History of Present Illness: .   Christy Powers is a 63 y.o. female with PMH hypertension, family history of CAD, hyperlipidemia, coronary artery calcification. She was previously followed by Dr. Alveta and established care with me on 04/10/24.  Pertinent CV history: Ca score 2021 of 24 (80th %ile).  Today: Has three main concerns: is she a candidate for HRT, what can she do about weight gain, has a twitch in her neck, has been to chiropractor, is this related to heart.  She follows at [facility]. Was told she is not a candidate for hormone replacement therapy her OB/GYN. She has not had a CV event or clot, but we did discuss that calcium  scores are a marker for plaque. Discussed that there is some discussion about changing the recommendations re: HRT and CV disease, but this is early. Discussed usually topical estrogen or non-hormonal systemic therapy is ok.  Discussed aspirin , statin today, see below. Discussed calcium  score, why we do not repeat.  Feels occasional chest tenderness across her entire chest. Non exertional, mild. She is able to be active without limitations, works in the yard.   ROS: Denies other chest pain, shortness of breath at rest or with normal exertion. No PND, orthopnea, LE edema or unexpected weight gain. No syncope or palpitations. ROS otherwise negative except as noted.   Studies Reviewed: SABRA    EKG:  EKG Interpretation Date/Time:  Monday April 10 2024 08:30:05 EST Ventricular Rate:  74 PR Interval:  156 QRS Duration:  80 QT Interval:  388 QTC Calculation: 430 R Axis:   -15  Text Interpretation: Normal sinus rhythm Normal ECG Confirmed by Powers Christy [phone]) on 04/10/2024 8:33:16 AM    Physical Exam:   VS:  BP 120/78   Pulse 75   Ht 5' 4.5  (1.638 m)   Wt 180 lb 4.8 oz (81.8 kg)   SpO2 97%   BMI 30.47 kg/m    Wt Readings from Last 3 Encounters:  04/10/24 180 lb 4.8 oz (81.8 kg)  04/13/23 171 lb 6.4 oz (77.7 kg)  01/06/23 164 lb 6.4 oz (74.6 kg)    GEN: Well nourished, well developed in no acute distress HEENT: Normal, moist mucous membranes NECK: No JVD CARDIAC: regular rhythm, normal S1 and S2, no rubs or gallops. No murmur. VASCULAR: Radial and DP pulses 2+ bilaterally. No carotid bruits RESPIRATORY:  Clear to auscultation without rales, wheezing or rhonchi  ABDOMEN: Soft, non-tender, non-distended MUSCULOSKELETAL:  Ambulates independently SKIN: Warm and dry, no edema NEUROLOGIC:  Alert and oriented x 3. No focal neuro deficits noted. PSYCHIATRIC:  Normal affect    ASSESSMENT AND PLAN: .    Coronary artery calcification, consistent with nonobstructive CAD Hypercholesterolemia -continue rosuvastatin  20 mg daily. Check lipids today (she is fasting) -discussed aspirin  today. She is amenable. -asking about HRT today. She has not had an event but does have (slightly elevated) calcium  score. No history of clotting issues. Discussed some of the newer discussions about HRT and risk. Discussed systemic vs topical  Hypertension -on hydrochlorothiazide  12.5 mg daily -has been taking potassium supplement, check labs to determine if still needed -continue bystolic , valsartan   CV risk counseling and prevention Obesity, BMI 30 -recommend heart healthy/Mediterranean diet, with whole grains, fruits, vegetable, fish, lean meats, nuts, and olive oil.  Limit salt. -recommend moderate walking, 3-5 times/week for 30-50 minutes each session. Aim for at least 150 minutes/week. Goal should be pace of 3 miles/hours, or walking 1.5 miles in 30 minutes -recommend avoidance of tobacco products. Avoid excess alcohol. -discussed obesity, GLP (risk of regain with short term use), diet/exercise recommendations  Dispo: 1 year or sooner as  needed  Signed, Christy Bruckner, MD   Christy Bruckner, MD, PhD, [facility]  [facility]  [place]  [facility] at [facility] at [facility] [address] [place], [place] [address] [phone]

## 2024-04-11 LAB — BASIC METABOLIC PANEL WITH GFR
BUN/Creatinine Ratio: 14 (ref 12–28)
BUN: 13 mg/dL (ref 8–27)
CO2: 25 mmol/L (ref 20–29)
Calcium: 9.6 mg/dL (ref 8.7–10.3)
Chloride: 102 mmol/L (ref 96–106)
Creatinine, Ser: 0.92 mg/dL (ref 0.57–1.00)
Glucose: 114 mg/dL — ABNORMAL HIGH (ref 70–99)
Potassium: 4.7 mmol/L (ref 3.5–5.2)
Sodium: 143 mmol/L (ref 134–144)
eGFR: 70 mL/min/1.73 (ref >59)

## 2024-04-11 LAB — LIPID PANEL
Chol/HDL Ratio: 2 ratio (ref 0.0–4.4)
Cholesterol, Total: 174 mg/dL (ref 100–199)
HDL: 85 mg/dL (ref >39)
LDL Chol Calc (NIH): 74 mg/dL (ref 0–99)
Triglycerides: 82 mg/dL (ref 0–149)
VLDL Cholesterol Cal: 15 mg/dL (ref 5–40)

## 2024-04-12 ENCOUNTER — Ambulatory Visit (HOSPITAL_BASED_OUTPATIENT_CLINIC_OR_DEPARTMENT_OTHER): Payer: Self-pay | Admitting: Cardiology

## 2024-04-18 ENCOUNTER — Encounter (HOSPITAL_BASED_OUTPATIENT_CLINIC_OR_DEPARTMENT_OTHER): Payer: Self-pay | Admitting: Cardiology

## 2024-04-20 MED ORDER — ROSUVASTATIN CALCIUM 40 MG PO TABS: 40.0000 mg | ORAL_TABLET | Freq: Every day | ORAL | 1 refills | Status: AC

## 2024-05-22 ENCOUNTER — Other Ambulatory Visit: Payer: Self-pay | Admitting: Physician Assistant

## 2024-05-22 DIAGNOSIS — Z79899 Other long term (current) drug therapy: Secondary | ICD-10-CM

## 2024-05-22 DIAGNOSIS — E782 Mixed hyperlipidemia: Secondary | ICD-10-CM

## 2024-05-22 DIAGNOSIS — I2510 Atherosclerotic heart disease of native coronary artery without angina pectoris: Secondary | ICD-10-CM

## 2024-05-22 DIAGNOSIS — I10 Essential (primary) hypertension: Secondary | ICD-10-CM
